# Patient Record
Sex: Male | Born: 1935 | Race: White | Hispanic: No | Smoking: Former smoker
Health system: Southern US, Community
[De-identification: ages and names within clinical notes are randomized; demographics above are authoritative.]

## PROBLEM LIST (undated history)

## (undated) DIAGNOSIS — C801 Malignant (primary) neoplasm, unspecified: Secondary | ICD-10-CM

## (undated) DIAGNOSIS — N289 Disorder of kidney and ureter, unspecified: Secondary | ICD-10-CM

---

## 2004-05-12 ENCOUNTER — Ambulatory Visit: Payer: Self-pay | Admitting: Gastroenterology

## 2004-07-06 ENCOUNTER — Ambulatory Visit: Payer: Self-pay | Admitting: Gastroenterology

## 2004-07-30 ENCOUNTER — Ambulatory Visit: Payer: Self-pay | Admitting: Gastroenterology

## 2004-08-02 ENCOUNTER — Ambulatory Visit: Payer: Self-pay | Admitting: Internal Medicine

## 2005-04-18 ENCOUNTER — Other Ambulatory Visit: Payer: Self-pay

## 2005-04-18 ENCOUNTER — Inpatient Hospital Stay: Payer: Self-pay | Admitting: Endocrinology

## 2005-04-19 ENCOUNTER — Other Ambulatory Visit: Payer: Self-pay

## 2005-04-25 ENCOUNTER — Ambulatory Visit: Payer: Self-pay | Admitting: Gastroenterology

## 2005-04-27 ENCOUNTER — Ambulatory Visit: Payer: Self-pay | Admitting: Gastroenterology

## 2005-05-04 ENCOUNTER — Ambulatory Visit: Payer: Self-pay | Admitting: Gastroenterology

## 2006-07-23 ENCOUNTER — Other Ambulatory Visit: Payer: Self-pay

## 2006-07-23 ENCOUNTER — Inpatient Hospital Stay: Payer: Self-pay | Admitting: Internal Medicine

## 2007-11-20 ENCOUNTER — Other Ambulatory Visit: Payer: Self-pay

## 2007-11-20 ENCOUNTER — Ambulatory Visit: Payer: Self-pay | Admitting: Ophthalmology

## 2008-01-14 ENCOUNTER — Ambulatory Visit: Payer: Self-pay | Admitting: Ophthalmology

## 2009-04-15 ENCOUNTER — Ambulatory Visit: Payer: Self-pay | Admitting: Gastroenterology

## 2010-01-05 ENCOUNTER — Ambulatory Visit: Payer: Self-pay | Admitting: Internal Medicine

## 2010-01-15 ENCOUNTER — Ambulatory Visit: Payer: Self-pay | Admitting: Gastroenterology

## 2012-07-31 ENCOUNTER — Emergency Department: Payer: Self-pay | Admitting: Emergency Medicine

## 2012-07-31 LAB — CBC
HCT: 41.2 % (ref 40.0–52.0)
HGB: 13.7 g/dL (ref 13.0–18.0)
MCH: 29.7 pg (ref 26.0–34.0)
MCHC: 33.3 g/dL (ref 32.0–36.0)
Platelet: 204 10*3/uL (ref 150–440)
RBC: 4.62 10*6/uL (ref 4.40–5.90)

## 2012-07-31 LAB — COMPREHENSIVE METABOLIC PANEL
Albumin: 3.6 g/dL (ref 3.4–5.0)
Alkaline Phosphatase: 77 U/L (ref 50–136)
Anion Gap: 7 (ref 7–16)
Calcium, Total: 8.9 mg/dL (ref 8.5–10.1)
Chloride: 104 mmol/L (ref 98–107)
Creatinine: 1.65 mg/dL — ABNORMAL HIGH (ref 0.60–1.30)
EGFR (Non-African Amer.): 37 — ABNORMAL LOW
Osmolality: 270 (ref 275–301)
SGOT(AST): 18 U/L (ref 15–37)
Sodium: 135 mmol/L — ABNORMAL LOW (ref 136–145)

## 2012-07-31 LAB — TROPONIN I: Troponin-I: <0.02 ng/mL

## 2012-07-31 LAB — CK TOTAL AND CKMB (NOT AT ARMC): CK, Total: 119 U/L (ref 35–232)

## 2013-05-25 ENCOUNTER — Emergency Department: Payer: Self-pay | Admitting: Emergency Medicine

## 2013-05-25 LAB — URINALYSIS, COMPLETE
Bilirubin,UR: NEGATIVE
Nitrite: POSITIVE
Ph: 6 (ref 4.5–8.0)
Protein: NEGATIVE
Specific Gravity: 1.004 (ref 1.003–1.030)

## 2013-05-25 LAB — CBC
HCT: 40.5 % (ref 40.0–52.0)
MCH: 29.9 pg (ref 26.0–34.0)
MCHC: 33.7 g/dL (ref 32.0–36.0)
MCV: 89 fL (ref 80–100)
RBC: 4.56 10*6/uL (ref 4.40–5.90)
RDW: 14.1 % (ref 11.5–14.5)
WBC: 9.9 10*3/uL (ref 3.8–10.6)

## 2013-05-25 LAB — BASIC METABOLIC PANEL
Anion Gap: 6 — ABNORMAL LOW (ref 7–16)
Chloride: 103 mmol/L (ref 98–107)
Co2: 24 mmol/L (ref 21–32)
Creatinine: 1.53 mg/dL — ABNORMAL HIGH (ref 0.60–1.30)
EGFR (African American): 46 — ABNORMAL LOW
Osmolality: 268 (ref 275–301)
Sodium: 133 mmol/L — ABNORMAL LOW (ref 136–145)

## 2015-02-05 ENCOUNTER — Emergency Department: Payer: Medicare Other

## 2015-02-05 ENCOUNTER — Inpatient Hospital Stay
Admission: EM | Admit: 2015-02-05 | Discharge: 2015-02-13 | DRG: 477 | Disposition: A | Discharge status: Skilled Nursing Facility | Payer: Medicare Other | Attending: Internal Medicine | Admitting: Internal Medicine

## 2015-02-05 ENCOUNTER — Encounter: Payer: Self-pay | Admitting: *Deleted

## 2015-02-05 DIAGNOSIS — R0600 Dyspnea, unspecified: Secondary | ICD-10-CM

## 2015-02-05 DIAGNOSIS — J479 Bronchiectasis, uncomplicated: Secondary | ICD-10-CM | POA: Diagnosis present

## 2015-02-05 DIAGNOSIS — H3530 Unspecified macular degeneration: Secondary | ICD-10-CM | POA: Diagnosis present

## 2015-02-05 DIAGNOSIS — N390 Urinary tract infection, site not specified: Secondary | ICD-10-CM | POA: Diagnosis present

## 2015-02-05 DIAGNOSIS — J9690 Respiratory failure, unspecified, unspecified whether with hypoxia or hypercapnia: Secondary | ICD-10-CM

## 2015-02-05 DIAGNOSIS — Z66 Do not resuscitate: Secondary | ICD-10-CM | POA: Diagnosis not present

## 2015-02-05 DIAGNOSIS — Z87891 Personal history of nicotine dependence: Secondary | ICD-10-CM

## 2015-02-05 DIAGNOSIS — G9341 Metabolic encephalopathy: Secondary | ICD-10-CM | POA: Diagnosis not present

## 2015-02-05 DIAGNOSIS — I10 Essential (primary) hypertension: Secondary | ICD-10-CM | POA: Diagnosis present

## 2015-02-05 DIAGNOSIS — Z79899 Other long term (current) drug therapy: Secondary | ICD-10-CM | POA: Diagnosis not present

## 2015-02-05 DIAGNOSIS — K2270 Barrett's esophagus without dysplasia: Secondary | ICD-10-CM | POA: Diagnosis present

## 2015-02-05 DIAGNOSIS — F05 Delirium due to known physiological condition: Secondary | ICD-10-CM | POA: Diagnosis not present

## 2015-02-05 DIAGNOSIS — J8410 Pulmonary fibrosis, unspecified: Secondary | ICD-10-CM | POA: Diagnosis not present

## 2015-02-05 DIAGNOSIS — W07XXXA Fall from chair, initial encounter: Secondary | ICD-10-CM | POA: Diagnosis present

## 2015-02-05 DIAGNOSIS — J690 Pneumonitis due to inhalation of food and vomit: Secondary | ICD-10-CM | POA: Diagnosis not present

## 2015-02-05 DIAGNOSIS — I959 Hypotension, unspecified: Secondary | ICD-10-CM | POA: Diagnosis not present

## 2015-02-05 DIAGNOSIS — M818 Other osteoporosis without current pathological fracture: Secondary | ICD-10-CM | POA: Diagnosis present

## 2015-02-05 DIAGNOSIS — K920 Hematemesis: Secondary | ICD-10-CM | POA: Diagnosis not present

## 2015-02-05 DIAGNOSIS — M1990 Unspecified osteoarthritis, unspecified site: Secondary | ICD-10-CM | POA: Diagnosis present

## 2015-02-05 DIAGNOSIS — I2510 Atherosclerotic heart disease of native coronary artery without angina pectoris: Secondary | ICD-10-CM | POA: Diagnosis present

## 2015-02-05 DIAGNOSIS — K5641 Fecal impaction: Secondary | ICD-10-CM | POA: Diagnosis present

## 2015-02-05 DIAGNOSIS — R0902 Hypoxemia: Secondary | ICD-10-CM

## 2015-02-05 DIAGNOSIS — Z86010 Personal history of colonic polyps: Secondary | ICD-10-CM | POA: Diagnosis not present

## 2015-02-05 DIAGNOSIS — Z515 Encounter for palliative care: Secondary | ICD-10-CM | POA: Diagnosis not present

## 2015-02-05 DIAGNOSIS — I272 Other secondary pulmonary hypertension: Secondary | ICD-10-CM | POA: Diagnosis present

## 2015-02-05 DIAGNOSIS — J9621 Acute and chronic respiratory failure with hypoxia: Secondary | ICD-10-CM | POA: Diagnosis not present

## 2015-02-05 DIAGNOSIS — J9600 Acute respiratory failure, unspecified whether with hypoxia or hypercapnia: Secondary | ICD-10-CM | POA: Diagnosis not present

## 2015-02-05 DIAGNOSIS — Y95 Nosocomial condition: Secondary | ICD-10-CM | POA: Diagnosis not present

## 2015-02-05 DIAGNOSIS — I4891 Unspecified atrial fibrillation: Secondary | ICD-10-CM | POA: Diagnosis present

## 2015-02-05 DIAGNOSIS — J84112 Idiopathic pulmonary fibrosis: Secondary | ICD-10-CM | POA: Diagnosis present

## 2015-02-05 DIAGNOSIS — Z885 Allergy status to narcotic agent status: Secondary | ICD-10-CM

## 2015-02-05 DIAGNOSIS — IMO0002 Reserved for concepts with insufficient information to code with codable children: Secondary | ICD-10-CM

## 2015-02-05 DIAGNOSIS — E871 Hypo-osmolality and hyponatremia: Secondary | ICD-10-CM | POA: Diagnosis present

## 2015-02-05 DIAGNOSIS — Z7982 Long term (current) use of aspirin: Secondary | ICD-10-CM

## 2015-02-05 DIAGNOSIS — S22080A Wedge compression fracture of T11-T12 vertebra, initial encounter for closed fracture: Secondary | ICD-10-CM | POA: Diagnosis present

## 2015-02-05 DIAGNOSIS — Z419 Encounter for procedure for purposes other than remedying health state, unspecified: Secondary | ICD-10-CM

## 2015-02-05 DIAGNOSIS — K5900 Constipation, unspecified: Secondary | ICD-10-CM | POA: Diagnosis present

## 2015-02-05 DIAGNOSIS — S22088A Other fracture of T11-T12 vertebra, initial encounter for closed fracture: Secondary | ICD-10-CM | POA: Diagnosis present

## 2015-02-05 DIAGNOSIS — Y92009 Unspecified place in unspecified non-institutional (private) residence as the place of occurrence of the external cause: Secondary | ICD-10-CM | POA: Diagnosis not present

## 2015-02-05 DIAGNOSIS — K210 Gastro-esophageal reflux disease with esophagitis: Secondary | ICD-10-CM | POA: Diagnosis present

## 2015-02-05 DIAGNOSIS — J9601 Acute respiratory failure with hypoxia: Secondary | ICD-10-CM | POA: Diagnosis not present

## 2015-02-05 HISTORY — DX: Disorder of kidney and ureter, unspecified: N28.9

## 2015-02-05 HISTORY — DX: Malignant (primary) neoplasm, unspecified: C80.1

## 2015-02-05 LAB — CBC WITH DIFFERENTIAL/PLATELET
BASOS PCT: 1 %
Basophils Absolute: 0.1 10*3/uL (ref 0–0.1)
Eosinophils Absolute: 0.3 10*3/uL (ref 0–0.7)
Eosinophils Relative: 2 %
HEMATOCRIT: 46 % (ref 40.0–52.0)
Hemoglobin: 15.2 g/dL (ref 13.0–18.0)
LYMPHS ABS: 3.4 10*3/uL (ref 1.0–3.6)
Lymphocytes Relative: 24 %
MCH: 29.7 pg (ref 26.0–34.0)
MCHC: 33.2 g/dL (ref 32.0–36.0)
MCV: 89.7 fL (ref 80.0–100.0)
MONOS PCT: 9 %
Monocytes Absolute: 1.3 10*3/uL — ABNORMAL HIGH (ref 0.2–1.0)
Neutro Abs: 9 10*3/uL — ABNORMAL HIGH (ref 1.4–6.5)
Neutrophils Relative %: 64 %
Platelets: 192 10*3/uL (ref 150–440)
RBC: 5.12 MIL/uL (ref 4.40–5.90)
RDW: 14.6 % — ABNORMAL HIGH (ref 11.5–14.5)
WBC: 14.2 10*3/uL — AB (ref 3.8–10.6)

## 2015-02-05 LAB — COMPREHENSIVE METABOLIC PANEL
ALBUMIN: 3.9 g/dL (ref 3.5–5.0)
ALT: 9 U/L — AB (ref 17–63)
AST: 24 U/L (ref 15–41)
Alkaline Phosphatase: 69 U/L (ref 38–126)
Anion gap: 10 (ref 5–15)
BILIRUBIN TOTAL: 1 mg/dL (ref 0.3–1.2)
BUN: 20 mg/dL (ref 6–20)
CALCIUM: 8.9 mg/dL (ref 8.9–10.3)
CO2: 23 mmol/L (ref 22–32)
Chloride: 96 mmol/L — ABNORMAL LOW (ref 101–111)
Creatinine, Ser: 1.16 mg/dL (ref 0.61–1.24)
GFR calc Af Amer: >60 mL/min (ref >60)
GFR, EST NON AFRICAN AMERICAN: 54 mL/min — AB (ref >60)
Glucose, Bld: 104 mg/dL — ABNORMAL HIGH (ref 65–99)
POTASSIUM: 4.6 mmol/L (ref 3.5–5.1)
Sodium: 129 mmol/L — ABNORMAL LOW (ref 135–145)
Total Protein: 7.7 g/dL (ref 6.5–8.1)

## 2015-02-05 LAB — URINALYSIS COMPLETE WITH MICROSCOPIC (ARMC ONLY)
BILIRUBIN URINE: NEGATIVE
Glucose, UA: NEGATIVE mg/dL
Ketones, ur: NEGATIVE mg/dL
Nitrite: POSITIVE — AB
PH: 5 (ref 5.0–8.0)
PROTEIN: NEGATIVE mg/dL
Specific Gravity, Urine: 1.013 (ref 1.005–1.030)

## 2015-02-05 LAB — TROPONIN I: Troponin I: <0.03 ng/mL (ref <0.031)

## 2015-02-05 MED ORDER — MORPHINE SULFATE 2 MG/ML IJ SOLN
2.0000 mg | INTRAMUSCULAR | Status: DC | PRN
  Administered 2015-02-05 – 2015-02-06 (×3): 2 mg via INTRAVENOUS
  Administered 2015-02-06: 1 mg via INTRAVENOUS
  Administered 2015-02-07 – 2015-02-08 (×2): 2 mg via INTRAVENOUS
  Filled 2015-02-05 (×6): qty 1

## 2015-02-05 MED ORDER — DOXEPIN HCL 25 MG PO CAPS
25.0000 mg | ORAL_CAPSULE | Freq: Every day | ORAL | Status: DC
  Administered 2015-02-05 – 2015-02-12 (×7): 25 mg via ORAL
  Filled 2015-02-05 (×10): qty 1

## 2015-02-05 MED ORDER — AMLODIPINE BESY-BENAZEPRIL HCL 5-10 MG PO CAPS: 1.0000 | ORAL_CAPSULE | Freq: Every day | ORAL | Status: DC

## 2015-02-05 MED ORDER — PANTOPRAZOLE SODIUM 40 MG PO TBEC
40.0000 mg | DELAYED_RELEASE_TABLET | Freq: Every day | ORAL | Status: DC
  Administered 2015-02-06: 40 mg via ORAL
  Filled 2015-02-05: qty 1

## 2015-02-05 MED ORDER — LORAZEPAM 1 MG PO TABS
1.0000 mg | ORAL_TABLET | Freq: Three times a day (TID) | ORAL | Status: DC
  Administered 2015-02-05 – 2015-02-13 (×21): 1 mg via ORAL
  Filled 2015-02-05: qty 1
  Filled 2015-02-05: qty 2
  Filled 2015-02-05: qty 1
  Filled 2015-02-05: qty 2
  Filled 2015-02-05: qty 1
  Filled 2015-02-05: qty 2
  Filled 2015-02-05: qty 1
  Filled 2015-02-05: qty 2
  Filled 2015-02-05 (×13): qty 1

## 2015-02-05 MED ORDER — HEPARIN SODIUM (PORCINE) 5000 UNIT/ML IJ SOLN
5000.0000 [IU] | Freq: Three times a day (TID) | INTRAMUSCULAR | Status: DC
  Administered 2015-02-05 – 2015-02-06 (×2): 5000 [IU] via SUBCUTANEOUS
  Filled 2015-02-05 (×2): qty 1

## 2015-02-05 MED ORDER — ACETAMINOPHEN 325 MG PO TABS
650.0000 mg | ORAL_TABLET | Freq: Four times a day (QID) | ORAL | Status: DC | PRN
  Administered 2015-02-12 (×2): 650 mg via ORAL
  Filled 2015-02-05 (×2): qty 2

## 2015-02-05 MED ORDER — ASPIRIN EC 81 MG PO TBEC
81.0000 mg | DELAYED_RELEASE_TABLET | Freq: Every day | ORAL | Status: DC
  Administered 2015-02-06 – 2015-02-13 (×8): 81 mg via ORAL
  Filled 2015-02-05 (×8): qty 1

## 2015-02-05 MED ORDER — AMLODIPINE BESYLATE 5 MG PO TABS
5.0000 mg | ORAL_TABLET | Freq: Every day | ORAL | Status: DC
  Administered 2015-02-06 – 2015-02-09 (×4): 5 mg via ORAL
  Filled 2015-02-05 (×4): qty 1

## 2015-02-05 MED ORDER — ONDANSETRON HCL 4 MG PO TABS: 4.0000 mg | ORAL_TABLET | Freq: Four times a day (QID) | ORAL | Status: DC | PRN

## 2015-02-05 MED ORDER — MAGNESIUM HYDROXIDE 400 MG/5ML PO SUSP
30.0000 mL | Freq: Every day | ORAL | Status: DC | PRN
  Administered 2015-02-06: 30 mL via ORAL
  Filled 2015-02-05: qty 30

## 2015-02-05 MED ORDER — ONDANSETRON HCL 4 MG/2ML IJ SOLN
4.0000 mg | Freq: Four times a day (QID) | INTRAMUSCULAR | Status: DC | PRN
  Administered 2015-02-06 (×2): 4 mg via INTRAVENOUS
  Filled 2015-02-05 (×2): qty 2

## 2015-02-05 MED ORDER — DEXTROSE 5 % IV SOLN
1.0000 g | INTRAVENOUS | Status: DC
  Administered 2015-02-05 – 2015-02-07 (×3): 1 g via INTRAVENOUS
  Filled 2015-02-05 (×4): qty 10

## 2015-02-05 MED ORDER — OXYCODONE HCL 5 MG PO TABS
5.0000 mg | ORAL_TABLET | ORAL | Status: DC | PRN
  Administered 2015-02-06 – 2015-02-11 (×4): 5 mg via ORAL
  Filled 2015-02-05: qty 1
  Filled 2015-02-05: qty 2
  Filled 2015-02-05 (×2): qty 1

## 2015-02-05 MED ORDER — ACETAMINOPHEN 650 MG RE SUPP: 650.0000 mg | Freq: Four times a day (QID) | RECTAL | Status: DC | PRN

## 2015-02-05 MED ORDER — CEFTRIAXONE SODIUM IN DEXTROSE 20 MG/ML IV SOLN: 1.0000 g | INTRAVENOUS | Status: DC

## 2015-02-05 MED ORDER — LINACLOTIDE 145 MCG PO CAPS
145.0000 ug | ORAL_CAPSULE | Freq: Every day | ORAL | Status: DC
  Administered 2015-02-06 – 2015-02-13 (×8): 145 ug via ORAL
  Filled 2015-02-05 (×9): qty 1

## 2015-02-05 MED ORDER — SODIUM CHLORIDE 0.9 % IV SOLN
INTRAVENOUS | Status: DC
  Administered 2015-02-05 – 2015-02-06 (×2): via INTRAVENOUS

## 2015-02-05 MED ORDER — BENAZEPRIL HCL 20 MG PO TABS
10.0000 mg | ORAL_TABLET | Freq: Every day | ORAL | Status: DC
  Administered 2015-02-06 – 2015-02-09 (×4): 10 mg via ORAL
  Filled 2015-02-05 (×3): qty 1
  Filled 2015-02-05: qty 2

## 2015-02-05 NOTE — ED Notes (Signed)
Past several days c/o low abd pain,, saw md yesterday for uti, has been on oral laxatives, no bm past week, some nausea

## 2015-02-05 NOTE — ED Notes (Signed)
Diagnosed with UTI yesterday, no BM in 1 week despite OTC medications. Pain in lower back x 1 week, worse with movement. Difficulty taking antibiotic due to nausea.

## 2015-02-05 NOTE — H&P (Signed)
[facility] [place] NAME: Richard Marsh    MR#:  [id number]  DATE OF BIRTH:  07/17/1922   DATE OF ADMISSION:  02/05/2015  PRIMARY CARE PHYSICIAN: Kary Kos  REQUESTING/REFERRING PHYSICIAN: Malinda  CHIEF COMPLAINT:    back pain, constipation  HISTORY OF PRESENT ILLNESS:  Richard Marsh  is a 81 y.o. male with a known history of chronic constipation, GERD with esophagitis, essential hypertension presenting with back pain. Denies any recent trauma or falls approximately 1 week prior had an episode where he apparently "slid off the couch" no pain at that time. Now describes back pain, lower back over the spine approximately 4 days worse with movement, nonradiating, "pain" quality, 8/10 intensity. Has chronic issues with constipation however the last bowel movement approximately 1 week ago still passing flatus, poor by mouth intake.  PAST MEDICAL HISTORY:   Past Medical History  Diagnosis Date  . Cancer   . Renal disorder     PAST SURGICAL HISTORY:  History reviewed. No pertinent past surgical history.  SOCIAL HISTORY:   History  Substance Use Topics  . Smoking status: Former Research scientist (life sciences)  . Smokeless tobacco: Not on file  . Alcohol Use: No    FAMILY HISTORY:   Family History  Problem Relation Age of Onset  . Heart failure Neg Hx     DRUG ALLERGIES:   Allergies  Allergen Reactions  . Demerol [Meperidine] Anaphylaxis  . Statins Other (See Comments)    Reaction:  Unknown     REVIEW OF SYSTEMS:  REVIEW OF SYSTEMS:  CONSTITUTIONAL: Denies fevers, chills, fatigue, weakness.  EYES: Denies blurred vision, double vision, or eye pain.  EARS, NOSE, THROAT: Denies tinnitus, ear pain, hearing loss.  RESPIRATORY: denies cough, shortness of breath, wheezing  CARDIOVASCULAR: Denies chest pain, palpitations, edema.  GASTROINTESTINAL: Positive, nausea, denies vomiting, diarrhea, abdominal pain. Positive constipation GENITOURINARY:  Denies dysuria, hematuria.  ENDOCRINE: Denies nocturia or thyroid problems. HEMATOLOGIC AND LYMPHATIC: Denies easy bruising or bleeding.  SKIN: Denies rash or lesions.  MUSCULOSKELETAL: Denies pain in neck, shoulder, knees, hips, or further arthritic symptoms. Positive back pain NEUROLOGIC: Denies paralysis, paresthesias.  PSYCHIATRIC: Denies anxiety or depressive symptoms. Otherwise full review of systems performed by me is negative.   MEDICATIONS AT HOME:   Prior to Admission medications   Medication Sig Start Date End Date Taking? Authorizing Provider  amLODipine-benazepril (LOTREL) 5-10 MG per capsule Take 1 capsule by mouth daily.   Yes Historical Provider, MD  aspirin EC 81 MG tablet Take 81 mg by mouth daily.   Yes Historical Provider, MD  doxepin (SINEQUAN) 25 MG capsule Take 25 mg by mouth at bedtime.   Yes Historical Provider, MD  Linaclotide Rolan Lipa) 145 MCG CAPS capsule Take 145 mcg by mouth daily.   Yes Historical Provider, MD  LORazepam (ATIVAN) 1 MG tablet Take 1 mg by mouth 3 (three) times daily.   Yes Historical Provider, MD  omeprazole (PRILOSEC) 20 MG capsule Take 20 mg by mouth 2 (two) times daily.   Yes Historical Provider, MD      VITAL SIGNS:  Blood pressure 166/88, pulse 80, temperature 97.9 F (36.6 C), temperature source Oral, resp. rate 20, height 5\' 10"  (1.778 m), weight 160 lb (72.576 kg), SpO2 94 %.  PHYSICAL EXAMINATION:  VITAL SIGNS: Filed Vitals:   02/05/15 2154  BP: 166/88  Pulse: 80  Temp:   Resp: 20   GENERAL:81 y.o.male currently in no acute distress.  HEAD:  Normocephalic, atraumatic.  EYES: Pupils equal, round, reactive to light. Extraocular muscles intact. No scleral icterus.  MOUTH: Moist mucosal membrane. Dentition intact. No abscess noted.  EAR, NOSE, THROAT: Clear without exudates. No external lesions.  NECK: Supple. No thyromegaly. No nodules. No JVD.  PULMONARY: Clear to ascultation, without wheeze rails or rhonci. No use of  accessory muscles, Good respiratory effort. good air entry bilaterally CHEST: Nontender to palpation.  CARDIOVASCULAR: S1 and S2. Regular rate and rhythm. No murmurs, rubs, or gallops. No edema. Pedal pulses 2+ bilaterally.  GASTROINTESTINAL: Soft, nontender, nondistended. No masses. Positive bowel sounds. No hepatosplenomegaly.  MUSCULOSKELETAL: No swelling, clubbing, or edema. Range of motion full in all extremities. Pain on palpation over the spine and lower back no paraspinal tenderness. NEUROLOGIC: Cranial nerves II through XII are intact. No gross focal neurological deficits. Sensation intact. Reflexes intact.  SKIN: No ulceration, lesions, rashes, or cyanosis. Skin warm and dry. Turgor intact.  PSYCHIATRIC: Mood, affect within normal limits. The patient is awake, alert and oriented x 3. Insight, judgment intact.    LABORATORY PANEL:   CBC  Recent Labs Lab 02/05/15 1934  WBC 14.2*  HGB 15.2  HCT 46.0  PLT 192   ------------------------------------------------------------------------------------------------------------------  Chemistries   Recent Labs Lab 02/05/15 1934  NA 129*  K 4.6  CL 96*  CO2 23  GLUCOSE 104*  BUN 20  CREATININE 1.16  CALCIUM 8.9  AST 24  ALT 9*  ALKPHOS 69  BILITOT 1.0   ------------------------------------------------------------------------------------------------------------------  Cardiac Enzymes  Recent Labs Lab 02/05/15 1934  TROPONINI <0.03   ------------------------------------------------------------------------------------------------------------------  RADIOLOGY:  Dg Lumbar Spine Complete  02/05/2015   CLINICAL DATA:  Low abdominal pain and lower back pain for 4 days.  EXAM: LUMBAR SPINE - COMPLETE 4+ VIEW  COMPARISON:  None.  FINDINGS: There are moderate compressions of T12 and L2. These are probably new or worsened from a lateral chest radiograph of 07/31/2012, but not necessarily acute. The other lumbar vertebrae are  normal in height. Moderately severe degenerative disc disease is present at L4-5, and there is sclerotic facet arthropathy at L5-S1, left greater than right. There is a grade 1 retrolisthesis at L4-5 which appears to be degenerative. There is no bone lesion or bony destruction. Sacroiliac joints appear unremarkable.  IMPRESSION: Moderately severe lumbar degenerative disc and facet change, greatest at L4-5 and L5-S1.  Moderate compressions of T12 and L2 which are of indeterminate age but probably new since 07/31/2012.   Electronically Signed   By: Andreas Newport M.D.   On: 02/05/2015 19:24   Dg Abd 1 View  02/05/2015   CLINICAL DATA:  Lower abdominal pain and low back pain.  EXAM: ABDOMEN - 1 VIEW  COMPARISON:  CT scan of the abdomen dated 04/27/2005  FINDINGS: There is prominent gas in the ascending and transverse portions of the colon but the colon is not abnormally distended. No dilated small bowel.  There are compression deformities of T12 and L2, age indeterminate.  IMPRESSION: Benign appearing abdomen. Compression fractures in the lower thoracic and upper lumbar spine, age indeterminate.   Electronically Signed   By: Lorriane Shire M.D.   On: 02/05/2015 19:24   Ct Thoracic Spine Wo Contrast  02/05/2015   CLINICAL DATA:  Compression fractures of T12 and L2.  Back pain.  EXAM: CT THORACIC SPINE WITHOUT CONTRAST  TECHNIQUE: Multidetector CT imaging of the thoracic spine was performed without intravenous contrast administration. Multiplanar CT image reconstructions were also generated.  COMPARISON:  Radiographs  dated 02/05/2015  FINDINGS: There is an acute fracture of the superior endplate of U13 with minimal protrusion of the posterior superior aspect of T12 into the spinal canal without neural impingement. There is no disc protrusion at T11-12 or T12-L1. There is slight soft tissue stranding to the right of T12.  There is also a compression fracture of the inferior endplate of T3 which is new since the  prior chest x-ray of 07/31/2010. I think this fracture is subacute. There is no paraspinal soft tissue stranding.  There is slight accentuation of the thoracic kyphosis. Remainder of the thoracic spine is normal with no disc protrusions or masses. The patient has severe chronic interstitial lung disease. Extensive coronary artery calcification. Moderate hiatal hernia.  IMPRESSION: Acute benign appearing fracture of the superior endplate of K44. Subacute benign appearing fracture of the inferior endplate of T3. No neural impingement at either level.  Severe chronic interstitial lung disease.   Electronically Signed   By: Lorriane Shire M.D.   On: 02/05/2015 20:58   Ct Lumbar Spine Wo Contrast  02/05/2015   CLINICAL DATA:  Back pain for 1 week. T12 and L2 compression fractures.  EXAM: CT LUMBAR SPINE WITHOUT CONTRAST  TECHNIQUE: Multidetector CT imaging of the lumbar spine was performed without intravenous contrast administration. Multiplanar CT image reconstructions were also generated.  COMPARISON:  Radiographs dated 02/04/2014  FINDINGS: There is an acute benign appearing fracture of the superior endplate of W10 with minimal bone protrusion into the spinal canal without neural impingement.  There is an old compression fracture of L2, healed. Prominent Schmorl's node in the superior endplate. No protrusion of bone or disc material into the spinal canal.  The patient has severe degenerative disc disease at L3-4, L4-5, and L5-S1.  L2-3:  Small broad-based disc bulge with no neural impingement.  L3-4:  Small broad-based disc bulge with no neural impingement.  L4-5: Severe right facet arthritis. Moderate right foraminal stenosis. No disc protrusion.  L5-S1: No disc protrusion or neural impingement. Moderately severe left facet arthritis.  IMPRESSION: 1. Acute benign appearing superior endplate fracture of U72 without neural impingement. 2. Old compression fracture of L2. 3. No focal neural impingement.    Electronically Signed   By: Lorriane Shire M.D.   On: 02/05/2015 21:01    EKG:   Orders placed or performed in visit on 05/25/13  . EKG 12-Lead    IMPRESSION AND PLAN:   81 year old Caucasian gentleman history of GERD with esophagitis presenting with back pain.  1. T12 compression fracture, initial evaluation: Provide pain medications consult orthopedic surgery further input, had bowel regimen 2. Constipation: Initiate bowel regiment 3. Hyponatremia, given poor by mouth intake: Sodium deficit around 360 mEq, IV fluid hydration normal saline follow sodium levels 4. UTI, site unspecified: Ceftriaxone, follow culture data and adjust antibiotics accordingly 5. GERD with esophagitis: PPI therapy 6. Venous thromboembolism prophylactic: Heparin subcutaneous    All the records are reviewed and case discussed with ED provider. Management plans discussed with the patient, family and they are in agreement.  CODE STATUS: Full  TOTAL TIME TAKING CARE OF THIS PATIENT: 35 minutes.    Hower,  Karenann Cai.D on 02/05/2015 at 10:44 PM  Between 7am to 6pm - Pager - [phone]  After 6pm: House Pager: - [phone]  [facility]  Office  [phone]  CC: Primary care physician; Kary Kos

## 2015-02-05 NOTE — ED Provider Notes (Signed)
[facility] Emergency Department Provider Note  ____________________________________________  Time seen: Approximately 6:56 PM  I have reviewed the triage vital signs and the nursing notes.   HISTORY  Chief Complaint Fecal Impaction    HPI Richard Marsh is a 81 y.o. male patient complains of about a week or more low back pain. Since that started he's had trouble passing his stool. He is tried multiple over-the-counter remedies including mag citrate this morning with no results. His daughter who is here with him and provides most history is a Marine scientist. He is not having any numbness in his groin area. Or any weakness in the legs. Dr. Kary Kos diagnosed him with a UTI and when the mag citrate did not work today told him to come to the emergency room. Patient reports low back pain is worse with movement. Patient is getting nauseated and having difficulty taking Cipro for his UTI. Patient had not had a bowel movement for about a week.   Past Medical History  Diagnosis Date  . Cancer   . Renal disorder     There are no active problems to display for this patient.   History reviewed. No pertinent past surgical history.  Current Outpatient Rx  Name  Route  Sig  Dispense  Refill  . amLODipine-benazepril (LOTREL) 5-10 MG per capsule   Oral   Take 1 capsule by mouth daily.         Marland Kitchen aspirin EC 81 MG tablet   Oral   Take 81 mg by mouth daily.         Marland Kitchen doxepin (SINEQUAN) 25 MG capsule   Oral   Take 25 mg by mouth at bedtime.         . Linaclotide (LINZESS) 145 MCG CAPS capsule   Oral   Take 145 mcg by mouth daily.         Marland Kitchen LORazepam (ATIVAN) 1 MG tablet   Oral   Take 1 mg by mouth 3 (three) times daily.         Marland Kitchen omeprazole (PRILOSEC) 20 MG capsule   Oral   Take 20 mg by mouth 2 (two) times daily.           Allergies Demerol and Statins  No family history on file.  Social History History  Substance Use Topics  . Smoking status:  Former Research scientist (life sciences)  . Smokeless tobacco: Not on file  . Alcohol Use: No    Review of Systems Constitutional: No fever/chills Eyes: No visual changes. ENT: No sore throat. Cardiovascular: Denies chest pain. Respiratory: Denies shortness of breath. Gastrointestinal: No abdominal pain.  No nausea, no vomiting.  No diarrhea.  No constipation. Genitourinary: Negative for dysuria. Musculoskeletal: Negative for back pain. Skin: Negative for rash. Neurological: Negative for headaches, focal weakness or numbness.  10-point ROS otherwise negative.  ____________________________________________   PHYSICAL EXAM:  VITAL SIGNS: ED Triage Vitals  Enc Vitals Group     BP 02/05/15 1742 128/67 mmHg     Pulse Rate 02/05/15 1742 84     Resp 02/05/15 1742 20     Temp 02/05/15 1742 97.9 F (36.6 C)     Temp Source 02/05/15 1742 Oral     SpO2 02/05/15 1742 93 %     Weight 02/05/15 1742 160 lb (72.576 kg)     Height 02/05/15 1742 5\' 10"  (1.778 m)     Head Cir --      Peak Flow --  Pain Score 02/05/15 1743 5     Pain Loc --      Pain Edu? --      Excl. in [place]? --     Constitutional: Alert and oriented. Well appearing and in no acute distress. Eyes: Conjunctivae are normal. PERRL. EOMI. Head: Atraumatic. Nose: No congestion/rhinnorhea. Mouth/Throat: Mucous membranes are moist.  Oropharynx non-erythematous. Neck: No stridor.  Cardiovascular: Normal rate, regular rhythm. Grossly normal heart sounds.  Good peripheral circulation. Respiratory: Normal respiratory effort.  No retractions. Lungs CTAB. Gastrointestinal: Soft and nontender. No distention. No abdominal bruits. No CVA tenderness. Musculoskeletal: No lower extremity tenderness nor edema.  No joint effusions. L-spine is tender from [place] T12 and L2 worse and lower down around the sacrum. Neurologic:  Normal speech and language. No gross focal neurologic deficits are appreciated. No gait instability. Skin:  Skin is warm, dry and intact.  No rash noted. Psychiatric: Mood and affect are normal. Speech and behavior are normal. Rectal exam shows good tone nontender prostate Hemoccult-negative is no stool in the rectum very high at the end of my finger there is some stool ____________________________________________   LABS (all labs ordered are listed, but only abnormal results are displayed)  Labs Reviewed  COMPREHENSIVE METABOLIC PANEL - Abnormal; Notable for the following:    Sodium 129 (*)    Chloride 96 (*)    Glucose, Bld 104 (*)    ALT 9 (*)    GFR calc non Af Amer 54 (*)    All other components within normal limits  CBC WITH DIFFERENTIAL/PLATELET - Abnormal; Notable for the following:    WBC 14.2 (*)    RDW 14.6 (*)    Neutro Abs 9.0 (*)    Monocytes Absolute 1.3 (*)    All other components within normal limits  URINALYSIS COMPLETEWITH MICROSCOPIC ([facility] ONLY) - Abnormal; Notable for the following:    Color, Urine YELLOW (*)    APPearance CLEAR (*)    Hgb urine dipstick 3+ (*)    Nitrite POSITIVE (*)    Leukocytes, UA 1+ (*)    Bacteria, UA FEW (*)    Squamous Epithelial / LPF 0-5 (*)    All other components within normal limits  URINE CULTURE  TROPONIN I   ____________________________________________  EKG   ____________________________________________  RADIOLOGY  X-ray shows DJD in the lumbar spine with old L2 compression fracture and an acute T12 compression fracture. CT of the back shows some retropulsion of T12 but no neuro impingement I reviewed the x-rays but not the CT ____________________________________________   PROCEDURES   ____________________________________________   INITIAL IMPRESSION / ASSESSMENT AND PLAN / ED COURSE  Pertinent labs & imaging results that were available during my care of the patient were reviewed by me and considered in my medical decision making (see chart for details).  Patient's daughter reports he is not doing well at home unable to move. Patient is  ambulatory about coming into the hospital. ____________________________________________   FINAL CLINICAL IMPRESSION(S) / ED DIAGNOSES  Final diagnoses:  Compression fracture  Hyponatremia      Nena Polio, MD 02/05/15 2131

## 2015-02-05 NOTE — ED Notes (Signed)
Patient transported to CT 

## 2015-02-06 ENCOUNTER — Inpatient Hospital Stay: Payer: Medicare Other

## 2015-02-06 ENCOUNTER — Inpatient Hospital Stay: Payer: Medicare Other | Admitting: Anesthesiology

## 2015-02-06 ENCOUNTER — Encounter
Admission: EM | Disposition: A | Discharge status: Skilled Nursing Facility | Payer: Self-pay | Source: Home / Self Care | Attending: Internal Medicine

## 2015-02-06 HISTORY — PX: KYPHOPLASTY: SHX5884

## 2015-02-06 LAB — BASIC METABOLIC PANEL
ANION GAP: 9 (ref 5–15)
BUN: 18 mg/dL (ref 6–20)
CHLORIDE: 97 mmol/L — AB (ref 101–111)
CO2: 27 mmol/L (ref 22–32)
Calcium: 8.9 mg/dL (ref 8.9–10.3)
Creatinine, Ser: 1.16 mg/dL (ref 0.61–1.24)
GFR calc Af Amer: >60 mL/min (ref >60)
GFR, EST NON AFRICAN AMERICAN: 54 mL/min — AB (ref >60)
Glucose, Bld: 114 mg/dL — ABNORMAL HIGH (ref 65–99)
POTASSIUM: 4.4 mmol/L (ref 3.5–5.1)
Sodium: 133 mmol/L — ABNORMAL LOW (ref 135–145)

## 2015-02-06 LAB — MRSA PCR SCREENING: MRSA by PCR: NEGATIVE

## 2015-02-06 SURGERY — KYPHOPLASTY
Anesthesia: Monitor Anesthesia Care | Wound class: Clean

## 2015-02-06 MED ORDER — MIDAZOLAM HCL 2 MG/2ML IJ SOLN
INTRAMUSCULAR | Status: DC | PRN
  Administered 2015-02-06: .25 mg via INTRAVENOUS
  Administered 2015-02-06: 0.5 mg via INTRAVENOUS
  Administered 2015-02-06: .5 mg via INTRAVENOUS

## 2015-02-06 MED ORDER — IOPAMIDOL (ISOVUE-300) INJECTION 61%
INTRAVENOUS | Status: DC | PRN
  Administered 2015-02-06: 1 mL via INTRAVENOUS

## 2015-02-06 MED ORDER — FLEET ENEMA 7-19 GM/118ML RE ENEM: 1.0000 | ENEMA | Freq: Once | RECTAL | Status: AC | PRN

## 2015-02-06 MED ORDER — BUPIVACAINE-EPINEPHRINE (PF) 0.5% -1:200000 IJ SOLN
INTRAMUSCULAR | Status: DC | PRN
  Administered 2015-02-06: 10 mL via PERINEURAL

## 2015-02-06 MED ORDER — CEFAZOLIN SODIUM 1-5 GM-% IV SOLN
1.0000 g | INTRAVENOUS | Status: DC
  Filled 2015-02-06 (×2): qty 50

## 2015-02-06 MED ORDER — PROMETHAZINE HCL 25 MG/ML IJ SOLN
6.2500 mg | Freq: Four times a day (QID) | INTRAMUSCULAR | Status: DC | PRN
  Administered 2015-02-06: 6.25 mg via INTRAVENOUS

## 2015-02-06 MED ORDER — KETAMINE HCL 50 MG/ML IJ SOLN
INTRAMUSCULAR | Status: DC | PRN
  Administered 2015-02-06: 25 mg via INTRAMUSCULAR

## 2015-02-06 MED ORDER — PHENYLEPHRINE HCL 10 MG/ML IJ SOLN
INTRAMUSCULAR | Status: DC | PRN
  Administered 2015-02-06 (×2): 100 ug via INTRAVENOUS

## 2015-02-06 MED ORDER — CEFAZOLIN (ANCEF) 1 G IV SOLR
1.0000 g | INTRAVENOUS | Status: DC
Start: 2015-02-06 — End: 2015-02-06

## 2015-02-06 MED ORDER — FENTANYL CITRATE (PF) 100 MCG/2ML IJ SOLN: 25.0000 ug | INTRAMUSCULAR | Status: DC | PRN

## 2015-02-06 MED ORDER — LIDOCAINE HCL 1 % IJ SOLN
INTRAMUSCULAR | Status: DC | PRN
  Administered 2015-02-06 (×2): 10 mL

## 2015-02-06 MED ORDER — BISACODYL 10 MG RE SUPP
10.0000 mg | Freq: Once | RECTAL | Status: AC
  Administered 2015-02-07: 10 mg via RECTAL
  Filled 2015-02-06: qty 1

## 2015-02-06 MED ORDER — SODIUM CHLORIDE 0.9 % IV SOLN
INTRAVENOUS | Status: DC
  Administered 2015-02-06 – 2015-02-08 (×3): via INTRAVENOUS

## 2015-02-06 MED ORDER — PROPOFOL INFUSION 10 MG/ML OPTIME
INTRAVENOUS | Status: DC | PRN
  Administered 2015-02-06: 40 ug/kg/min via INTRAVENOUS

## 2015-02-06 MED ORDER — DOCUSATE SODIUM 100 MG PO CAPS
100.0000 mg | ORAL_CAPSULE | Freq: Two times a day (BID) | ORAL | Status: DC
  Administered 2015-02-07: 100 mg via ORAL
  Filled 2015-02-06: qty 1

## 2015-02-06 MED ORDER — ENOXAPARIN SODIUM 40 MG/0.4ML ~~LOC~~ SOLN
40.0000 mg | SUBCUTANEOUS | Status: DC
  Administered 2015-02-07 – 2015-02-09 (×3): 40 mg via SUBCUTANEOUS
  Filled 2015-02-06 (×3): qty 0.4

## 2015-02-06 MED ORDER — PANTOPRAZOLE SODIUM 40 MG IV SOLR
40.0000 mg | INTRAVENOUS | Status: DC
  Administered 2015-02-06 – 2015-02-09 (×4): 40 mg via INTRAVENOUS
  Filled 2015-02-06 (×4): qty 40

## 2015-02-06 MED ORDER — ONDANSETRON HCL 4 MG/2ML IJ SOLN: 4.0000 mg | Freq: Once | INTRAMUSCULAR | Status: AC | PRN

## 2015-02-06 MED ORDER — FENTANYL CITRATE (PF) 100 MCG/2ML IJ SOLN
INTRAMUSCULAR | Status: DC | PRN
  Administered 2015-02-06 (×2): 25 ug via INTRAVENOUS

## 2015-02-06 MED ORDER — BISACODYL 10 MG RE SUPP: 10.0000 mg | Freq: Every day | RECTAL | Status: DC | PRN

## 2015-02-06 MED ORDER — LACTATED RINGERS IV SOLN
INTRAVENOUS | Status: DC | PRN
  Administered 2015-02-06: 22:00:00 via INTRAVENOUS

## 2015-02-06 SURGICAL SUPPLY — 17 items
BNDG ADH 2 X3.75 FABRIC TAN LF (GAUZE/BANDAGES/DRESSINGS) ×3 IMPLANT
CEMENT KYPHON CX01A KIT/MIXER (Cement) ×3 IMPLANT
DEVICE BIOPSY BONE KYPHX (INSTRUMENTS) ×3 IMPLANT
DRAPE C-ARM XRAY 36X54 (DRAPES) ×3 IMPLANT
DURAPREP 26ML APPLICATOR (WOUND CARE) ×3 IMPLANT
GLOVE SURG CUT RESIS 520032000 (GLOVE) ×3 IMPLANT
GLOVE SURG ORTHO 9.0 STRL STRW (GLOVE) ×3 IMPLANT
GOWN SPECIALTY ULTRA XL (MISCELLANEOUS) ×3 IMPLANT
GOWN STRL REUS W/ TWL LRG LVL3 (GOWN DISPOSABLE) ×1 IMPLANT
GOWN STRL REUS W/TWL LRG LVL3 (GOWN DISPOSABLE) ×2
LIQUID BAND (GAUZE/BANDAGES/DRESSINGS) ×3 IMPLANT
NDL SAFETY 18GX1.5 (NEEDLE) ×3 IMPLANT
PACK KYPHOPLASTY (MISCELLANEOUS) ×3 IMPLANT
STRAP SAFETY BODY (MISCELLANEOUS) ×3 IMPLANT
SYR 20CC LL (SYRINGE) ×3 IMPLANT
TRAY KYPHOPAK 15/3 EXPRESS 1ST (MISCELLANEOUS) ×3 IMPLANT
TRAY KYPHOPAK 20/3 EXPRESS 1ST (MISCELLANEOUS) ×3 IMPLANT

## 2015-02-06 NOTE — Anesthesia Procedure Notes (Signed)
Date/Time: 02/06/2015 9:36 PM Performed by: Nelda Marseille Pre-anesthesia Checklist: Patient identified, Emergency Drugs available, Suction available, Patient being monitored and Timeout performed Oxygen Delivery Method: Nasal cannula

## 2015-02-06 NOTE — Care Management (Signed)
Patient was out of the room when I rounded. RNCM will need to follow. CM consult placed. List of home health agencies left in patient's room. RNCM please assess for DME needs also. Kyphoplasty determination pending.

## 2015-02-06 NOTE — Progress Notes (Signed)
Initial Nutrition Assessment  INTERVENTION:   Medical Food Supplement Therapy: will recommend Ensure BID once diet order able to be advanced   NUTRITION DIAGNOSIS:   Inadequate oral intake related to inability to eat as evidenced by NPO status.  GOAL:   Patient will meet greater than or equal to 90% of their needs  MONITOR:    (Energy Intake, digestive System, Anthropometrics)  REASON FOR ASSESSMENT:   Malnutrition Screening Tool    ASSESSMENT:   Pt admitted with UTI, constipation and T12 compression fracture. Pt scheduled for vertebroplasty.  Past Medical History  Diagnosis Date  . Cancer   . Renal disorder     Diet Order:  Diet NPO time specified    Current Nutrition: Pt NPO  Food/Nutrition-Related History: Pt reports poor po intake PTA. Pt reports eating toast times 2 andyogurt for breakfast, cheeseburger and mt dew for lunch and sandwich with Ensure for dinner most days. Pt reports appetite has decreased 'for a long time.'   Medications: Dulcolax, protonix, NS at  42mL/hr  Electrolyte/Renal Profile and Glucose Profile:   Recent Labs Lab 02/05/15 1934 02/06/15 0540  NA 129* 133*  K 4.6 4.4  CL 96* 97*  CO2 23 27  BUN 20 18  CREATININE 1.16 1.16  CALCIUM 8.9 8.9  GLUCOSE 104* 114*   Protein Profile:  Recent Labs Lab 02/05/15 1934  ALBUMIN 3.9    Gastrointestinal Profile: Last BM: unknown   Nutrition-Focused Physical Exam Findings: Nutrition-Focused physical exam completed. Findings are WDL for fat depletion however could not assess ribs, WDL for muscle depletion with the exception of mild thigh muscle depletion, and no edema.     Weight Change: Pt reports weight loss for the past 1-2 years. Pt reports weight of 190lbs a few years ago (17% weight loss).    Skin:  Reviewed, no issues   Height:   Ht Readings from Last 1 Encounters:  02/05/15 5\' 10"  (1.778 m)    Weight:   Wt Readings from Last 1 Encounters:  02/05/15 157 lb 11.2  oz (71.532 kg)     BMI:  Body mass index is 22.63 kg/(m^2).  Estimated Nutritional Needs:   Kcal:  BEE: 1352kcals, TEE: (IF 1.1-1.3)(AF 1.2) 1816-2147kcals  Protein:  72-86g protein (1.0-1.2g/kg)  Fluid:  1788-2123mL of fluid (25-24mL/kg)  EDUCATION NEEDS:   No education needs identified at this time   Grant, RD, LDN Pager [phone]

## 2015-02-06 NOTE — Progress Notes (Signed)
[place] unable to void since admission around 2300, bladder scan reading of 886mL. Dr Marcille Blanco, MD on call notified, ordered In & out cath. Nursing continues to monitor and assist with ADLs.

## 2015-02-06 NOTE — Consult Note (Signed)
Patient seen at request Dr. Roland Rack after he did initial evaluation for possible kyphoplasty. Patient's MRI does show a subacute T12 fracture with significant edema and has the appearance of a subacute fracture there is consistent with his history. He has severe tenderness to percussion at the T12 spinous process. He also has some lower backache but that is not as severe and is not was keeping him from being able to walk he does not have clonus or neuro deficits reviewed with the bone model in booklet with him and his granddaughter the nature of the procedure will try to have that done later today. Risks benefits possible competitions discussed. Also discussed this with his daughter who is a Marine scientist and [employer]

## 2015-02-06 NOTE — Progress Notes (Signed)
Informed by Dr Rudene Christians (ortho) patient noted to have coffee ground emesis post op Will add protonix

## 2015-02-06 NOTE — Consult Note (Addendum)
ORTHOPAEDIC CONSULTATION  REQUESTING PHYSICIAN: Epifanio Lesches, MD  Chief Complaint:   Mid lower back pain 4-5 days  History of Present Illness: Richard Marsh is a 81 y.o. male with a history of hypertension, gastroesophageal reflux disease with esophagitis, and chronic constipation who lives at home. Apparently, he presented to the emergency room last night complaining of a 4 day history of increased lower back pain. The patient denies any specific injury, but does recall "sliding off the couch" about a week ago and landing on his buttock. He did not notice any lower back pain immediately, but began developing pain several days later. His symptoms have persisted despite activity modification and over-the-counter medications. In the emergency room, x-rays and supplement CT scan confirmed the presence of a subacute T12 compression fracture involving the superior endplate with minimal retropulsion of the posterior elements. He also was noted to have a urinary tract infection and so was admitted to the hospitalist service. The patient denies any numbness or paresthesias to either lower extremity. He denies any loss of bowel or bladder control, but does acknowledge his chronic constipation issues, as well as the recent "kidney infection".  Past Medical History  Diagnosis Date  . Cancer   . Renal disorder    History reviewed. No pertinent past surgical history. History   Social History  . Marital Status: Widowed    Spouse Name: N/A  . Number of Children: N/A  . Years of Education: N/A   Social History Main Topics  . Smoking status: Former Research scientist (life sciences)  . Smokeless tobacco: Not on file  . Alcohol Use: No  . Drug Use: Not on file  . Sexual Activity: Not on file   Other Topics Concern  . None   Social History Narrative  . None   Family History  Problem Relation Age of Onset  . Heart failure Neg Hx    Allergies   Allergen Reactions  . Demerol [Meperidine] Anaphylaxis  . Statins Other (See Comments)    Reaction:  Unknown    Prior to Admission medications   Medication Sig Start Date End Date Taking? Authorizing Provider  amLODipine-benazepril (LOTREL) 5-10 MG per capsule Take 1 capsule by mouth daily.   Yes Historical Provider, MD  aspirin EC 81 MG tablet Take 81 mg by mouth daily.   Yes Historical Provider, MD  doxepin (SINEQUAN) 25 MG capsule Take 25 mg by mouth at bedtime.   Yes Historical Provider, MD  Linaclotide Rolan Lipa) 145 MCG CAPS capsule Take 145 mcg by mouth daily.   Yes Historical Provider, MD  LORazepam (ATIVAN) 1 MG tablet Take 1 mg by mouth 3 (three) times daily.   Yes Historical Provider, MD  omeprazole (PRILOSEC) 20 MG capsule Take 20 mg by mouth 2 (two) times daily.   Yes Historical Provider, MD   Dg Lumbar Spine Complete  02/05/2015   CLINICAL DATA:  Low abdominal pain and lower back pain for 4 days.  EXAM: LUMBAR SPINE - COMPLETE 4+ VIEW  COMPARISON:  None.  FINDINGS: There are moderate compressions of T12 and L2. These are probably new or worsened from a lateral chest radiograph of 07/31/2012, but not necessarily acute. The other lumbar vertebrae are normal in height. Moderately severe degenerative disc disease is present at L4-5, and there is sclerotic facet arthropathy at L5-S1, left greater than right. There is a grade 1 retrolisthesis at L4-5 which appears to be degenerative. There is no bone lesion or bony destruction. Sacroiliac joints appear unremarkable.  IMPRESSION: Moderately  severe lumbar degenerative disc and facet change, greatest at L4-5 and L5-S1.  Moderate compressions of T12 and L2 which are of indeterminate age but probably new since 07/31/2012.   Electronically Signed   By: Andreas [facility] M.D.   On: 02/05/2015 19:24   Dg Abd 1 View  02/05/2015   CLINICAL DATA:  Lower abdominal pain and low back pain.  EXAM: ABDOMEN - 1 VIEW  COMPARISON:  CT scan of the abdomen  dated 04/27/2005  FINDINGS: There is prominent gas in the ascending and transverse portions of the colon but the colon is not abnormally distended. No dilated small bowel.  There are compression deformities of T12 and L2, age indeterminate.  IMPRESSION: Benign appearing abdomen. Compression fractures in the lower thoracic and upper lumbar spine, age indeterminate.   Electronically Signed   By: Lorriane Shire M.D.   On: 02/05/2015 19:24   Ct Thoracic Spine Wo Contrast  02/05/2015   CLINICAL DATA:  Compression fractures of T12 and L2.  Back pain.  EXAM: CT THORACIC SPINE WITHOUT CONTRAST  TECHNIQUE: Multidetector CT imaging of the thoracic spine was performed without intravenous contrast administration. Multiplanar CT image reconstructions were also generated.  COMPARISON:  Radiographs dated 02/05/2015  FINDINGS: There is an acute fracture of the superior endplate of U31 with minimal protrusion of the posterior superior aspect of T12 into the spinal canal without neural impingement. There is no disc protrusion at T11-12 or T12-L1. There is slight soft tissue stranding to the right of T12.  There is also a compression fracture of the inferior endplate of T3 which is new since the prior chest x-ray of 07/31/2010. I think this fracture is subacute. There is no paraspinal soft tissue stranding.  There is slight accentuation of the thoracic kyphosis. Remainder of the thoracic spine is normal with no disc protrusions or masses. The patient has severe chronic interstitial lung disease. Extensive coronary artery calcification. Moderate hiatal hernia.  IMPRESSION: Acute benign appearing fracture of the superior endplate of S97. Subacute benign appearing fracture of the inferior endplate of T3. No neural impingement at either level.  Severe chronic interstitial lung disease.   Electronically Signed   By: Lorriane Shire M.D.   On: 02/05/2015 20:58   Ct Lumbar Spine Wo Contrast  02/05/2015   CLINICAL DATA:  Back pain for 1  week. T12 and L2 compression fractures.  EXAM: CT LUMBAR SPINE WITHOUT CONTRAST  TECHNIQUE: Multidetector CT imaging of the lumbar spine was performed without intravenous contrast administration. Multiplanar CT image reconstructions were also generated.  COMPARISON:  Radiographs dated 02/04/2014  FINDINGS: There is an acute benign appearing fracture of the superior endplate of W26 with minimal bone protrusion into the spinal canal without neural impingement.  There is an old compression fracture of L2, healed. Prominent Schmorl's node in the superior endplate. No protrusion of bone or disc material into the spinal canal.  The patient has severe degenerative disc disease at L3-4, L4-5, and L5-S1.  L2-3:  Small broad-based disc bulge with no neural impingement.  L3-4:  Small broad-based disc bulge with no neural impingement.  L4-5: Severe right facet arthritis. Moderate right foraminal stenosis. No disc protrusion.  L5-S1: No disc protrusion or neural impingement. Moderately severe left facet arthritis.  IMPRESSION: 1. Acute benign appearing superior endplate fracture of V78 without neural impingement. 2. Old compression fracture of L2. 3. No focal neural impingement.   Electronically Signed   By: Lorriane Shire M.D.   On: 02/05/2015 21:01  Positive ROS: All other systems have been reviewed and were otherwise negative with the exception of those mentioned in the HPI and as above.  Physical Exam: General:  Alert, no acute distress Psychiatric:  Patient is competent for consent with normal mood and affect   Cardiovascular:  No pedal edema Respiratory:  No wheezing, non-labored breathing GI:  Abdomen is soft and non-tender Skin:  No lesions in the area of chief complaint Neurologic:  Sensation intact distally Lymphatic:  No axillary or cervical lymphadenopathy  Orthopedic Exam:  Orthopedic examination is limited to the back and bilateral lower extremities. Skin inspection of his back is unremarkable. He  has mild tenderness to percussion over the thoracolumbar region centrally, but has no other areas of tenderness. He is able to actively flex and extend both knees, ankles, and feet without pain. He has 4+/5 strength with resisted dorsiflexion, plantarflexion, inversion, and eversion of both feet. Sensation is intact to light touch to all distributions in both lower extremities. He has good capillary refill to both feet.  X-rays:  Recent x-rays and CT scans of the thoracic and lumbar spine are available for review. The findings are as described above.  Assessment: Acute T12 compression fracture.  Plan: The treatment options are discussed with the patient. He would like to consider a vertebroplasty. Therefore, I will discuss his case with Dr. Rudene Christians, my partner, who does perform this procedure to see when he can get the patient on the schedule. Meanwhile, he should be treated symptomatically with appropriate pain medication. In addition, I will order a lumbosacral corset for him to wear when he attempts to get out of bed in order to provide some additional stability to his lower back.  Thank you for ask me to participate in the care of this most pleasant yet unfortunate man. We will be happy to follow him with you.    Pascal Lux, MD  Beeper #:  [phone]  02/06/2015 7:53 AM  I spoke with Dr. Rudene Christians. He would like to have an MRI scan of the lumbar spine ordered in order to be sure that there are no impending compression fractures at the adjacent levels for proceeding with a vertebroplasty. I have ordered this study.  JJP

## 2015-02-06 NOTE — Op Note (Addendum)
02/05/2015 - 02/06/2015  10:20 PM  PATIENT:  Richard Marsh  81 y.o. male  PRE-OPERATIVE DIAGNOSIS:  fracture T12  POST-OPERATIVE DIAGNOSIS:  fraacture T12  PROCEDURE:  Procedure(s): KYPHOPLASTY T12 (N/A)  SURGEON: Laurene Footman, MD  ASSISTANTS: None  ANESTHESIA:   MAC  EBL:     BLOOD ADMINISTERED:none  DRAINS: none   LOCAL MEDICATIONS USED:  MARCAINE    and XYLOCAINE   SPECIMEN:  Source of Specimen:  T12 vertebral body  DISPOSITION OF SPECIMEN:  PATHOLOGY  COUNTS:  YES  TOURNIQUET:  * No tourniquets in log *  IMPLANTS: Bone cement  DICTATION: .Dragon Dictation patient brought the operating room and after adequate sedation was given the patient was placed prone. C-arm was brought in and good visualization of T12 was obtained in both AP and lateral projections. After appropriate patient identification and timeout procedure the skin was prepped with alcohol and 5 cc 1% Xylocaine was infiltrated subcutaneously on both sides of the back was then prepped and draped in sterile fashion and repeat timeout procedure completed. The right side a spinal needle was placed onto the pedicle and local aesthetic 50-50 mix of 1% Xylocaine have percent Sensorcaine with epinephrine symmetry along the tract from the skin to the pedicle. After allowing this to set a small incision was made and a trocar advanced to the pedicle and advanced into the vertebral body checking on AP and lateral image to make sure we did not invade the neural foramen or spinal canal. Biopsy was then obtained of the vertebral body drilling was carried out and a balloon inserted and inflated to 4.5 cc with good correction of the superior endplate compression. Cement was mixed and approximate 5 cc infiltrated into the vertebral body filling the cavity created and getting good interdigitation with bilateral fill. Trochars removed and permanent C-arm views were obtained. Skin was closed with Dermabond and covered with a  Band-Aid.  PLAN OF CARE: Continues inpatient  PATIENT DISPOSITION:  PACU - hemodynamically stable.    Vomited coffee ground emesis as he was moved to his bed.  Possible aspiration.

## 2015-02-06 NOTE — Progress Notes (Signed)
[facility] NAME: Richard Marsh    MR#:  [id number]  DATE OF BIRTH:  03/31/1923  SUBJECTIVE: 81 year old male admitted because of back pain and T12 compression fracture. Seen today complaints of severe back pain with minimal ambulation. No numbness or tingling into X, no loss of bowel or bladder control.   CHIEF COMPLAINT:   Chief Complaint  Patient presents with  . Fecal Impaction    REVIEW OF SYSTEMS:    Review of Systems  Constitutional: Negative for fever and chills.  HENT: Negative for hearing loss.   Eyes: Negative for blurred vision, double vision and photophobia.  Respiratory: Negative for cough, hemoptysis and shortness of breath.   Cardiovascular: Negative for palpitations, orthopnea and leg swelling.  Gastrointestinal: Negative for vomiting, abdominal pain and diarrhea.  Genitourinary: Negative for dysuria and urgency.  Musculoskeletal: Positive for back pain. Negative for myalgias and neck pain.  Skin: Negative for rash.  Neurological: Negative for dizziness, focal weakness, seizures, weakness and headaches.  Psychiatric/Behavioral: Negative for memory loss. The patient does not have insomnia.     Nutrition:  Tolerating Diet: Tolerating PT:      DRUG ALLERGIES:   Allergies  Allergen Reactions  . Demerol [Meperidine] Anaphylaxis  . Statins Other (See Comments)    Reaction:  Unknown     VITALS:  Blood pressure 121/69, pulse 83, temperature 98 F (36.7 C), temperature source Oral, resp. rate 18, height 5\' 10"  (1.778 m), weight 71.532 kg (157 lb 11.2 oz), SpO2 97 %.  PHYSICAL EXAMINATION:   Physical Exam  GENERAL:  81 y.o.-year-old patient lying in the bed with no acute distress.  EYES: Pupils equal, round, reactive to light and accommodation. No scleral icterus. Extraocular muscles intact.  HEENT: Head atraumatic, normocephalic. Oropharynx and nasopharynx clear.  NECK:  Supple, no jugular  venous distention. No thyroid enlargement, no tenderness.  LUNGS: Normal breath sounds bilaterally, no wheezing, rales,rhonchi or crepitation. No use of accessory muscles of respiration.  CARDIOVASCULAR: S1, S2 normal. No murmurs, rubs, or gallops.  ABDOMEN: Soft, nontender, nondistended. Bowel sounds present. No organomegaly or mass.  EXTREMITIES: No pedal edema, cyanosis, or clubbing.  NEUROLOGIC: Cranial nerves II through XII are intact. Muscle strength 5/5 in all extremities. Sensation intact. Gait not checked.  PSYCHIATRIC: The patient is alert and oriented x 3.  SKIN: No obvious rash, lesion, or ulcer.    LABORATORY PANEL:   CBC  Recent Labs Lab 02/05/15 1934  WBC 14.2*  HGB 15.2  HCT 46.0  PLT 192   ------------------------------------------------------------------------------------------------------------------  Chemistries   Recent Labs Lab 02/05/15 1934 02/06/15 0540  NA 129* 133*  K 4.6 4.4  CL 96* 97*  CO2 23 27  GLUCOSE 104* 114*  BUN 20 18  CREATININE 1.16 1.16  CALCIUM 8.9 8.9  AST 24  --   ALT 9*  --   ALKPHOS 69  --   BILITOT 1.0  --    ------------------------------------------------------------------------------------------------------------------  Cardiac Enzymes  Recent Labs Lab 02/05/15 1934  TROPONINI <0.03   ------------------------------------------------------------------------------------------------------------------  RADIOLOGY:  Dg Lumbar Spine Complete  02/05/2015   CLINICAL DATA:  Low abdominal pain and lower back pain for 4 days.  EXAM: LUMBAR SPINE - COMPLETE 4+ VIEW  COMPARISON:  None.  FINDINGS: There are moderate compressions of T12 and L2. These are probably new or worsened from a lateral chest radiograph of 07/31/2012, but not necessarily acute. The other lumbar vertebrae are normal in height. Moderately  severe degenerative disc disease is present at L4-5, and there is sclerotic facet arthropathy at L5-S1, left greater than  right. There is a grade 1 retrolisthesis at L4-5 which appears to be degenerative. There is no bone lesion or bony destruction. Sacroiliac joints appear unremarkable.  IMPRESSION: Moderately severe lumbar degenerative disc and facet change, greatest at L4-5 and L5-S1.  Moderate compressions of T12 and L2 which are of indeterminate age but probably new since 07/31/2012.   Electronically Signed   By: [facility] M.D.   On: 02/05/2015 19:24   Dg Abd 1 View  02/05/2015   CLINICAL DATA:  Lower abdominal pain and low back pain.  EXAM: ABDOMEN - 1 VIEW  COMPARISON:  CT scan of the abdomen dated 04/27/2005  FINDINGS: There is prominent gas in the ascending and transverse portions of the colon but the colon is not abnormally distended. No dilated small bowel.  There are compression deformities of T12 and L2, age indeterminate.  IMPRESSION: Benign appearing abdomen. Compression fractures in the lower thoracic and upper lumbar spine, age indeterminate.   Electronically Signed   By: Lorriane Shire M.D.   On: 02/05/2015 19:24   Ct Thoracic Spine Wo Contrast  02/05/2015   CLINICAL DATA:  Compression fractures of T12 and L2.  Back pain.  EXAM: CT THORACIC SPINE WITHOUT CONTRAST  TECHNIQUE: Multidetector CT imaging of the thoracic spine was performed without intravenous contrast administration. Multiplanar CT image reconstructions were also generated.  COMPARISON:  Radiographs dated 02/05/2015  FINDINGS: There is an acute fracture of the superior endplate of Q46 with minimal protrusion of the posterior superior aspect of T12 into the spinal canal without neural impingement. There is no disc protrusion at T11-12 or T12-L1. There is slight soft tissue stranding to the right of T12.  There is also a compression fracture of the inferior endplate of T3 which is new since the prior chest x-ray of 07/31/2010. I think this fracture is subacute. There is no paraspinal soft tissue stranding.  There is slight accentuation of the  thoracic kyphosis. Remainder of the thoracic spine is normal with no disc protrusions or masses. The patient has severe chronic interstitial lung disease. Extensive coronary artery calcification. Moderate hiatal hernia.  IMPRESSION: Acute benign appearing fracture of the superior endplate of N62. Subacute benign appearing fracture of the inferior endplate of T3. No neural impingement at either level.  Severe chronic interstitial lung disease.   Electronically Signed   By: Lorriane Shire M.D.   On: 02/05/2015 20:58   Ct Lumbar Spine Wo Contrast  02/05/2015   CLINICAL DATA:  Back pain for 1 week. T12 and L2 compression fractures.  EXAM: CT LUMBAR SPINE WITHOUT CONTRAST  TECHNIQUE: Multidetector CT imaging of the lumbar spine was performed without intravenous contrast administration. Multiplanar CT image reconstructions were also generated.  COMPARISON:  Radiographs dated 02/04/2014  FINDINGS: There is an acute benign appearing fracture of the superior endplate of X52 with minimal bone protrusion into the spinal canal without neural impingement.  There is an old compression fracture of L2, healed. Prominent Schmorl's node in the superior endplate. No protrusion of bone or disc material into the spinal canal.  The patient has severe degenerative disc disease at L3-4, L4-5, and L5-S1.  L2-3:  Small broad-based disc bulge with no neural impingement.  L3-4:  Small broad-based disc bulge with no neural impingement.  L4-5: Severe right facet arthritis. Moderate right foraminal stenosis. No disc protrusion.  L5-S1: No disc protrusion or neural  impingement. Moderately severe left facet arthritis.  IMPRESSION: 1. Acute benign appearing superior endplate fracture of P61 without neural impingement. 2. Old compression fracture of L2. 3. No focal neural impingement.   Electronically Signed   By: Lorriane Shire M.D.   On: 02/05/2015 21:01     ASSESSMENT AND PLAN:   Active Problems:   UTI (lower urinary tract infection)    T12 compression fracture   Constipation   Hyponatremia  #1 UTI continue Rocephin and follow urine cultures, #2 .hyponatremia improved with fluids. #3. T12 compression fracture: Continue pain medications,ortho is following patient scheduled to have MRI of lumbosacral spine,pt is considering  Vertebroplasty continue lumbar corset, pain medications, DVT prophylaxis  Till  then.     All the records are reviewed and case discussed with Care Management/Social Workerr. Management plans discussed with the patient, family and they are in agreement.  CODE STATUS: full  TOTAL TIME TAKING CARE OF THIS PATIENT: 35inutes.   POSSIBLE D/C IN 1-2 DAYS, DEPENDING ON CLINICAL CONDITION.   Epifanio Lesches M.D on 02/06/2015 at 10:13 AM  Between 7am to 6pm - Pager - [phone]  After 6pm go to [email or web address] - password [email or web address] [facility]  [facility]  Office  [phone]  CC: Primary care physician; No primary care provider on file.

## 2015-02-06 NOTE — Anesthesia Preprocedure Evaluation (Addendum)
Anesthesia Evaluation  Patient identified by MRN, date of birth, ID band Patient awake    Reviewed: Allergy & Precautions, NPO status , Patient's Chart, lab work & pertinent test results  Airway Mallampati: II       Dental  (+) Teeth Intact   Pulmonary former smoker,    Pulmonary exam normal       Cardiovascular hypertension, Pt. on medications Normal cardiovascular exam    Neuro/Psych    GI/Hepatic negative GI ROS, Neg liver ROS,   Endo/Other  negative endocrine ROS  Renal/GU Renal InsufficiencyRenal disease     Musculoskeletal negative musculoskeletal ROS (+)   Abdominal Normal abdominal exam  (+)   Peds  Hematology negative hematology ROS (+)   Anesthesia Other Findings   Reproductive/Obstetrics                            Anesthesia Physical Anesthesia Plan  ASA: III and emergent  Anesthesia Plan: MAC   Post-op Pain Management:    Induction: Intravenous  Airway Management Planned: Nasal Cannula  Additional Equipment:   Intra-op Plan:   Post-operative Plan:   Informed Consent:   Plan Discussed with: CRNA  Anesthesia Plan Comments:         Anesthesia Quick Evaluation

## 2015-02-06 NOTE — Progress Notes (Signed)
Paged dr. Vianne Bulls re pts inability to urinate. Pt has required i/o cath twice in last 12 hours.

## 2015-02-06 NOTE — Transfer of Care (Signed)
Immediate Anesthesia Transfer of Care Note  Patient: Richard Marsh  Procedure(s) Performed: Procedure(s): KYPHOPLASTY T12 (N/A)  Patient Location: PACU  Anesthesia Type:MAC  Level of Consciousness: sedated  Airway & Oxygen Therapy: Patient Spontanous Breathing and Patient connected to face mask oxygen  Post-op Assessment: Report given to RN and Post -op Vital signs reviewed and stable  Post vital signs: Reviewed and stable  Last Vitals:  Filed Vitals:   02/06/15 2018  BP: 144/77  Pulse: 85  Temp: 36.5 C  Resp:     Complications: No apparent anesthesia complications.  Pt might have had some aspiration of some vomitus.  VSS at this time.  Surgeon aware.

## 2015-02-06 NOTE — Anesthesia Postprocedure Evaluation (Signed)
  Anesthesia Post-op Note  Patient: Richard Marsh  Procedure(s) Performed: Procedure(s): KYPHOPLASTY T12 (N/A)  Anesthesia type:MAC  Patient location: PACU  Post pain: Pain level controlled  Post assessment: Post-op Vital signs reviewed, Patient's Cardiovascular Status Stable, Respiratory Function Stable, Patent Airway and No signs of Nausea or vomiting  Post vital signs: Reviewed and stable  Last Vitals:  Filed Vitals:   02/06/15 2255  BP:   Pulse: 101  Temp:   Resp: 15    Level of consciousness: awake, alert  and patient cooperative  Complications: No apparent anesthesia complications

## 2015-02-07 ENCOUNTER — Inpatient Hospital Stay: Payer: Medicare Other

## 2015-02-07 LAB — URINE CULTURE
CULTURE: NO GROWTH
SPECIAL REQUESTS: NORMAL

## 2015-02-07 LAB — GLUCOSE, CAPILLARY: Glucose-Capillary: 142 mg/dL — ABNORMAL HIGH (ref 65–99)

## 2015-02-07 MED ORDER — IPRATROPIUM-ALBUTEROL 0.5-2.5 (3) MG/3ML IN SOLN
RESPIRATORY_TRACT | Status: AC
  Filled 2015-02-07: qty 3

## 2015-02-07 MED ORDER — SENNOSIDES-DOCUSATE SODIUM 8.6-50 MG PO TABS
1.0000 | ORAL_TABLET | Freq: Two times a day (BID) | ORAL | Status: DC
  Administered 2015-02-07 – 2015-02-13 (×12): 1 via ORAL
  Filled 2015-02-07 (×12): qty 1

## 2015-02-07 MED ORDER — HALOPERIDOL LACTATE 5 MG/ML IJ SOLN
5.0000 mg | Freq: Once | INTRAMUSCULAR | Status: AC
  Administered 2015-02-07: 5 mg via INTRAVENOUS
  Filled 2015-02-07: qty 1

## 2015-02-07 MED ORDER — LACTULOSE 10 GM/15ML PO SOLN
20.0000 g | Freq: Two times a day (BID) | ORAL | Status: DC | PRN
  Administered 2015-02-07: 20 g via ORAL
  Filled 2015-02-07: qty 30

## 2015-02-07 MED ORDER — IPRATROPIUM-ALBUTEROL 0.5-2.5 (3) MG/3ML IN SOLN
3.0000 mL | Freq: Once | RESPIRATORY_TRACT | Status: AC
  Administered 2015-02-07: 3 mL via RESPIRATORY_TRACT

## 2015-02-07 NOTE — Clinical Social Work Placement (Signed)
   CLINICAL SOCIAL WORK PLACEMENT  NOTE  Date:  02/07/2015  Patient Details  Name: Richard Marsh MRN: [id number] Date of Birth: [birth year]  Clinical Social Work is seeking post-discharge placement for this patient at the [place] level of care (*CSW will initial, date and re-position this form in  chart as items are completed):  Yes   Patient/family provided with [facility] Work Department's list of facilities offering this level of care within the geographic area requested by the patient (or if unable, by the patient's family).  Yes   Patient/family informed of their freedom to choose among providers that offer the needed level of care, that participate in Medicare, Medicaid or managed care program needed by the patient, have an available bed and are willing to accept the patient.  Yes   Patient/family informed of [facility]'s ownership interest in [facility] and [facility], as well as of the fact that they are under no obligation to receive care at these facilities.  PASRR submitted to EDS on 02/07/15     PASRR number received on 02/07/15     Existing PASRR number confirmed on       FL2 transmitted to all facilities in geographic area requested by pt/family on 02/07/15     FL2 transmitted to all facilities within larger geographic area on       Patient informed that his/her managed care company has contracts with or will negotiate with certain facilities, including the following:            Patient/family informed of bed offers received.  Patient chooses bed at       Physician recommends and patient chooses bed at      Patient to be transferred to   on  .  Patient to be transferred to facility by       Patient family notified on   of transfer.  Name of family member notified:        PHYSICIAN Please sign FL2     Additional Comment:    _______________________________________________ Mathews Argyle, LCSW 02/07/2015,  4:26 PM

## 2015-02-07 NOTE — Progress Notes (Signed)
Physical Therapy Evaluation Patient Details Name: Richard Marsh MRN: [id number] DOB: [birth year] Today's Date: 02/07/2015   History of Present Illness  Richard Marsh is a 81 y.o. male with a known history of chronic constipation, GERD with esophagitis, essential hypertension presenting with back pain. Denies any recent trauma or falls approximately 1 week prior had an episode where he apparently "slid off the couch" no pain at that time. Now describes back pain, lower back over the spine approximately 4 days worse with movement, nonradiating, "pain" quality, 8/10 intensity. Imaging  Imaging revealed T12 compression fx.  Clinical Impression  Pt ambulated 40 ft with RW and CGA for lateral unsteadiness, and second person to manage equipment. Pt is able to move from supine to sitting with use of bed rails and extended time. Pt's activity is limited by fatigue and pain. Oxygen saturation at rest is 88% on 5 liters O2. 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 Pt is able to follow instructions easily. Discussed pt's condition at length with family members who agreed to skilled nursing placement at this time.    Follow Up Recommendations SNF    Equipment Recommendations       Recommendations for Other Services       Precautions / Restrictions Restrictions Weight Bearing Restrictions: No      Mobility  Bed Mobility Overal bed mobility: Modified Independent (uses bed rails, requires extra time and effort)                Transfers Overall transfer level: Needs assistance Equipment used: Rolling walker (2 wheeled)             General transfer comment: CGA for transfers, verbal cues for sequencing sit-stand and stand-sit  Ambulation/Gait Ambulation/Gait assistance: Min guard;+2 safety/equipment Ambulation Distance (Feet): 40 Feet Assistive device: Rolling walker (2 wheeled) Gait Pattern/deviations: Decreased step length - right;Decreased step  length - left;Shuffle (Partial step-through pattern, lateral unsteadiness) Gait velocity: 0.2 Gait velocity interpretation: <1.8 ft/sec, indicative of risk for recurrent falls General Gait Details: 10 min of gait training in addition to evaluation  Stairs            Wheelchair Mobility    Modified Rankin (Stroke Patients Only)       Balance Overall balance assessment: History of Falls (neutral stance unspported 5 sec before LOB)                                           Pertinent Vitals/Pain Pain Assessment:  (5/10 at rest; 7/10 with ambulation)    Home Living Family/patient expects to be discharged to:: Skilled nursing facility Living Arrangements:  (Family has arragned for assisted living placement)                    Prior Function           Comments: Pt was living alone with 6 hrs of help per day. Pt was able to ambulate independently with RW in the home and limited community ambulation. Does not drive.     Hand Dominance        Extremity/Trunk Assessment   Upper Extremity Assessment: Overall WFL for tasks assessed (UE ROM limited somewhat by hyperkyphosis)           Lower Extremity Assessment: Overall WFL for tasks assessed      Cervical / Trunk Assessment: Kyphotic  Communication      Cognition Arousal/Alertness:  (Sleeping but easily roused) Behavior During Therapy: WFL for tasks assessed/performed Overall Cognitive Status: Within Functional Limits for tasks assessed       Memory:  (able to remember 2 of 3 items in recall test)              General Comments General comments (skin integrity, edema, etc.): Pt on 5 l O2 nasal canula. O2 sats 88% at rest, 87 after ambulation; encouraged breathing through nose    Exercises        Assessment/Plan    PT Assessment Patient needs continued PT services  PT Diagnosis Difficulty walking;Generalized weakness   PT Problem List Decreased strength;Decreased  activity tolerance;Decreased balance;Decreased mobility;Cardiopulmonary status limiting activity  PT Treatment Interventions Gait training;Stair training;Functional mobility training;Therapeutic activities;Therapeutic exercise;Balance training;Neuromuscular re-education;Patient/family education;Manual techniques   PT Goals (Current goals can be found in the Care Plan section) Acute Rehab PT Goals Patient Stated Goal: to walk PT Goal Formulation: With patient/family Potential to Achieve Goals: Good    Frequency Min 2X/week   Barriers to discharge        Co-evaluation     PT goals addressed during session: Mobility/safety with mobility;Balance         End of Session Equipment Utilized During Treatment: Gait belt;Oxygen Activity Tolerance: Patient limited by fatigue;Patient tolerated treatment well Patient left: in bed;with call bell/phone within reach;with bed alarm set;with family/visitor present (bilateral heels floated) Nurse Communication: Mobility status (02 saturation)         Time: 3557-3220 PT Time Calculation (min) (ACUTE ONLY): 40 min   Charges:   PT Evaluation $Initial PT Evaluation Tier I: 1 Procedure PT Treatments $Gait Training: 8-22 mins   PT G Codes:      Violet Baldy, PT, [place] 02/07/2015, 11:02 AM

## 2015-02-07 NOTE — Progress Notes (Addendum)
[facility] NAME: Richard Marsh    MR#:  [id number]  DATE OF BIRTH:  1923/03/25  SUBJECTIVE:  CHIEF COMPLAINT:   Chief Complaint  Patient presents with  . Fecal Impaction   Sleeping this morning. No BM for one week. Feels he has to urinate, but has catheter in. Disoriented and combative overnight.  Oriented and calm this morning.  REVIEW OF SYSTEMS:   Review of Systems  Constitutional: Negative for fever.  Respiratory: Negative for shortness of breath.   Cardiovascular: Negative for chest pain and palpitations.  Gastrointestinal: Negative for nausea, vomiting and abdominal pain.  Genitourinary: Negative for dysuria.   DRUG ALLERGIES:   Allergies  Allergen Reactions  . Demerol [Meperidine] Anaphylaxis  . Statins Other (See Comments)    Reaction:  Unknown     VITALS:  Blood pressure 106/54, pulse 88, temperature 97.5 F (36.4 C), temperature source Oral, resp. rate 16, height 5\' 10"  (1.778 m), weight 71.532 kg (157 lb 11.2 oz), SpO2 93 %.  PHYSICAL EXAMINATION:  GENERAL:  81 y.o.-year-old patient lying in the bed with no acute distress. Sleepy EYES: Pupils equal, round, reactive to light and accommodation. No scleral icterus. Extraocular muscles intact.  HEENT: Head atraumatic, normocephalic. Oropharynx and nasopharynx clear.  NECK:  Supple, no jugular venous distention. No thyroid enlargement, no tenderness.  LUNGS: Normal breath sounds bilaterally, no wheezing, rales,rhonchi or crepitation. No use of accessory muscles of respiration.  CARDIOVASCULAR: S1, S2 normal. 3/6 SEM.  No rubs, or gallops.  ABDOMEN: Soft, nontender, nondistended. Bowel sounds present. No organomegaly or mass.  EXTREMITIES: No pedal edema, cyanosis, or clubbing.  NEUROLOGIC: Cranial nerves II through XII are intact.   PSYCHIATRIC: The patient is alert and oriented x 3.  SKIN: No obvious rash, lesion, or ulcer.    LABORATORY PANEL:    CBC  Recent Labs Lab 02/05/15 1934  WBC 14.2*  HGB 15.2  HCT 46.0  PLT 192   ------------------------------------------------------------------------------------------------------------------  Chemistries   Recent Labs Lab 02/05/15 1934 02/06/15 0540  NA 129* 133*  K 4.6 4.4  CL 96* 97*  CO2 23 27  GLUCOSE 104* 114*  BUN 20 18  CREATININE 1.16 1.16  CALCIUM 8.9 8.9  AST 24  --   ALT 9*  --   ALKPHOS 69  --   BILITOT 1.0  --    ------------------------------------------------------------------------------------------------------------------  Cardiac Enzymes  Recent Labs Lab 02/05/15 1934  TROPONINI <0.03   ------------------------------------------------------------------------------------------------------------------  RADIOLOGY:  Dg Lumbar Spine Complete  02/05/2015   CLINICAL DATA:  Low abdominal pain and lower back pain for 4 days.  EXAM: LUMBAR SPINE - COMPLETE 4+ VIEW  COMPARISON:  None.  FINDINGS: There are moderate compressions of T12 and L2. These are probably new or worsened from a lateral chest radiograph of 07/31/2012, but not necessarily acute. The other lumbar vertebrae are normal in height. Moderately severe degenerative disc disease is present at L4-5, and there is sclerotic facet arthropathy at L5-S1, left greater than right. There is a grade 1 retrolisthesis at L4-5 which appears to be degenerative. There is no bone lesion or bony destruction. Sacroiliac joints appear unremarkable.  IMPRESSION: Moderately severe lumbar degenerative disc and facet change, greatest at L4-5 and L5-S1.  Moderate compressions of T12 and L2 which are of indeterminate age but probably new since 07/31/2012.   Electronically Signed   By: Andreas Newport M.D.   On: 02/05/2015 19:24   Dg Abd 1 View  02/05/2015   CLINICAL DATA:  Lower abdominal pain and low back pain.  EXAM: ABDOMEN - 1 VIEW  COMPARISON:  CT scan of the abdomen dated 04/27/2005  FINDINGS: There is prominent  gas in the ascending and transverse portions of the colon but the colon is not abnormally distended. No dilated small bowel.  There are compression deformities of T12 and L2, age indeterminate.  IMPRESSION: Benign appearing abdomen. Compression fractures in the lower thoracic and upper lumbar spine, age indeterminate.   Electronically Signed   By: Lorriane Shire M.D.   On: 02/05/2015 19:24   Ct Thoracic Spine Wo Contrast  02/05/2015   CLINICAL DATA:  Compression fractures of T12 and L2.  Back pain.  EXAM: CT THORACIC SPINE WITHOUT CONTRAST  TECHNIQUE: Multidetector CT imaging of the thoracic spine was performed without intravenous contrast administration. Multiplanar CT image reconstructions were also generated.  COMPARISON:  Radiographs dated 02/05/2015  FINDINGS: There is an acute fracture of the superior endplate of I62 with minimal protrusion of the posterior superior aspect of T12 into the spinal canal without neural impingement. There is no disc protrusion at T11-12 or T12-L1. There is slight soft tissue stranding to the right of T12.  There is also a compression fracture of the inferior endplate of T3 which is new since the prior chest x-ray of 07/31/2010. I think this fracture is subacute. There is no paraspinal soft tissue stranding.  There is slight accentuation of the thoracic kyphosis. Remainder of the thoracic spine is normal with no disc protrusions or masses. The patient has severe chronic interstitial lung disease. Extensive coronary artery calcification. Moderate hiatal hernia.  IMPRESSION: Acute benign appearing fracture of the superior endplate of V03. Subacute benign appearing fracture of the inferior endplate of T3. No neural impingement at either level.  Severe chronic interstitial lung disease.   Electronically Signed   By: Lorriane Shire M.D.   On: 02/05/2015 20:58   Ct Lumbar Spine Wo Contrast  02/05/2015   CLINICAL DATA:  Back pain for 1 week. T12 and L2 compression fractures.  EXAM:  CT LUMBAR SPINE WITHOUT CONTRAST  TECHNIQUE: Multidetector CT imaging of the lumbar spine was performed without intravenous contrast administration. Multiplanar CT image reconstructions were also generated.  COMPARISON:  Radiographs dated 02/04/2014  FINDINGS: There is an acute benign appearing fracture of the superior endplate of J00 with minimal bone protrusion into the spinal canal without neural impingement.  There is an old compression fracture of L2, healed. Prominent Schmorl's node in the superior endplate. No protrusion of bone or disc material into the spinal canal.  The patient has severe degenerative disc disease at L3-4, L4-5, and L5-S1.  L2-3:  Small broad-based disc bulge with no neural impingement.  L3-4:  Small broad-based disc bulge with no neural impingement.  L4-5: Severe right facet arthritis. Moderate right foraminal stenosis. No disc protrusion.  L5-S1: No disc protrusion or neural impingement. Moderately severe left facet arthritis.  IMPRESSION: 1. Acute benign appearing superior endplate fracture of X38 without neural impingement. 2. Old compression fracture of L2. 3. No focal neural impingement.   Electronically Signed   By: Lorriane Shire M.D.   On: 02/05/2015 21:01   Mr Lumbar Spine Wo Contrast  02/06/2015   CLINICAL DATA:  Acute compression fracture of T12.  EXAM: MRI LUMBAR SPINE WITHOUT CONTRAST  TECHNIQUE: Multiplanar, multisequence MR imaging of the lumbar spine was performed. No intravenous contrast was administered.  COMPARISON:  CT scan dated 02/05/2015  FINDINGS: Normal  conus tip at L1. Normal paraspinal soft tissues. Multiple small bilateral renal cysts.  T11-12: Subacute benign appearing compression fracture of the superior endplate of W09. 3.4 mm retropulsion of the posterior superior aspect of T12 into the spinal canal with no neural impingement. The T11-12 disc is normal.  T12-L1:  Normal.  L1-2: Old compression fracture of the superior endplate of L2 with Schmorl's node  in the superior endplate. Tiny disc protrusion into the left neural foramen without neural impingement.  L2-3: Tiny disc protrusions into the inferior inferior aspects of both neural foramina with no neural impingement.  L3-4: Small broad-based soft disc protrusion no neural impingement. Slight hypertrophy of the ligamentum flavum and facet joints.  L4-5: Disc space narrowing asymmetric to the right. Small disc protrusion slightly to the left with no neural impingement. Moderate right facet arthritis. Moderately severe right foraminal stenosis. However, the right L4 nerve exits without impingement.  L5-S1: Small disc bulge into the right neural foramen with no neural impingement. Slight left facet arthritis. Disc space narrowing to the left with moderately severe left foraminal stenosis. The left L5 nerve appears to exit without impingement.  IMPRESSION: 1. Subacute benign appearing compression fracture of the superior endplate of W11. No neural impingement.  2. Old compression fracture of L2 with a Schmorl's node. No neural impingement. 3. Left foraminal stenosis at L5-S1 and right foraminal stenosis at L4-5.   Electronically Signed   By: Lorriane Shire M.D.   On: 02/06/2015 10:18   Dg Thoracic Spine 1 View  02/06/2015   CLINICAL DATA:  Kyphoplasty  EXAM: DG C-ARM 61-120 MIN; THORACIC SPINE - 1 VIEW  COMPARISON:  MRI 02/06/2015  FINDINGS: Single intraoperative spot image demonstrates changes of single level vertebral augmentation, likely at T12. No visible complicating feature.  IMPRESSION: Vertebral augmentation, likely at T12.   Electronically Signed   By: Rolm Baptise M.D.   On: 02/06/2015 22:38   Dg C-arm 1-60 Min  02/06/2015   CLINICAL DATA:  Kyphoplasty  EXAM: DG C-ARM 61-120 MIN; THORACIC SPINE - 1 VIEW  COMPARISON:  MRI 02/06/2015  FINDINGS: Single intraoperative spot image demonstrates changes of single level vertebral augmentation, likely at T12. No visible complicating feature.  IMPRESSION:  Vertebral augmentation, likely at T12.   Electronically Signed   By: Rolm Baptise M.D.   On: 02/06/2015 22:38    EKG:   Orders placed or performed in visit on 05/25/13  . EKG 12-Lead    ASSESSMENT AND PLAN:   1) UTI - urine culture negative so far - rocephin day 3/5  2) hyponatremia - improving  3) T12 compression fracture:  - s/p kyphoplasty. Pain greatly reduced.  4) delirium - likely post operative effect with underlying dementia - remove foley, decrease discomfort  5) constipation - Dr. Marry Guan has changed orders  6) dispo - SNF search - continue PT  7) hematemasis/coffee ground emesis - no further events. Monitor CBC  CODE STATUS: full  TOTAL TIME TAKING CARE OF THIS PATIENT: 35 minutes.  Greater than 50% of time spent in care coordination and counseling.All the records are reviewed and case discussed with Care Management/Social Workerr. Management plans discussed with the patient, nursing and Dr. Marry Guan  POSSIBLE D/C IN 2 DAYS, DEPENDING ON CLINICAL CONDITION.   Myrtis Ser M.D on 02/07/2015 at 11:44 AM  Between 7am to 6pm - Pager - [phone]  After 6pm go to [email or web address] - password [email or web address] [facility]  [facility]  Office  [phone]  CC: Primary  care physician; No primary care provider on file.

## 2015-02-07 NOTE — Progress Notes (Signed)
Dr.Hower notified change in patients baseline No new orders giving. See Flow sheet. Nursing will continue to monitor.

## 2015-02-07 NOTE — Progress Notes (Signed)
Dr. Marcille Blanco came to assess patient, new orders giving. Patient alert and oriented. Patient  HOB elevated and non re breather in place. Patient O2 stats WNL.

## 2015-02-07 NOTE — Consult Note (Signed)
Orthopaedics-  POD #1 - T12 kyphoplasty  Patient reports improvement with back pain. His primary complaint is constipation (no BM for 1 week).  Awake and alert.  Neurovascularly intact. Homan test negative.  Lactulose prn ordered. Colace changed to Senokot S bid Begin PT today.  James P. Holley Bouche M.D.

## 2015-02-07 NOTE — Progress Notes (Signed)
Patient confused, angry, and physically combative. Nursing at bedside. Patient trying to get out of bed. Re- orientation giving and comfort. Pain controlled with PRN medication. Dr. Marcille Blanco notified and new orders giving.

## 2015-02-07 NOTE — Clinical Social Work Note (Signed)
Clinical Social Work Assessment  Patient Details  Name: Richard Marsh MRN: [id number] Date of Birth: 05-08-1923  Date of referral:  02/07/15               Reason for consult:  Facility Placement                Permission sought to share information with:  Facility Sport and exercise psychologist, Family Supports Permission granted to share information::  No (pt was sleeping and would not wake for CSW)  Name::        Agency::     Relationship::     Contact Information:     Housing/Transportation Living arrangements for the past 2 months:  Single Family Home Source of Information:  Adult Children Patient Interpreter Needed:  None Criminal Activity/Legal Involvement Pertinent to Current Situation/Hospitalization:  No - Comment as needed Significant Relationships:  Adult Children Lives with:  Facility Resident (New ALF facility resident) Do you feel safe going back to the place where you live?    Need for family participation in patient care:  Yes (Comment)  Care giving concerns:  Pt's family is concerned that they will not get into the facility of their choice due to limited options on the weekend.  CSW will f/u with offers once they have been received.   Social Worker assessment / plan:  CSW spoke to pt's family.  They would like to get pt to SNF if possible.  If no CSW is attempting to contact ALF [facility].  Pt was scheduled to become a resident at this facility on Monday.   CSW is attempting to contact them to determine if they can assess pt this weekend for possible DC tomorrow if pt is medically stable.  CSW was give permission from pt's daughter Richard Marsh [phone] to do bed search for [place] facilities.     Employment status:  Retired Forensic scientist:  Medicare PT Recommendations:  [place] / Referral to community resources:  [place]  Patient/Family's Response to care:  Pt's family would like [facility] or [facility],  however CSW explained that neither of these facilities do weekend DC.  Family still agreed to bed search.  CSW will f/u with offers tomorrow.  Patient/Family's Understanding of and Emotional Response to Diagnosis, Current Treatment, and Prognosis:  Pt's family verbalized understanding of SNF DC and available options.  They may not choose a facility to DC to.  Emotional Assessment Appearance:  Appears stated age Attitude/Demeanor/Rapport:    Affect (typically observed):    Orientation:    Alcohol / Substance use:  Never Used Psych involvement (Current and /or in the community):  No (Comment)  Discharge Needs  Concerns to be addressed:  Care Coordination Readmission within the last 30 days:  No Current discharge risk:  Physical Impairment Barriers to Discharge:  No Barriers Identified   Mathews Argyle, LCSW 02/07/2015, 11:04 AM

## 2015-02-07 NOTE — Progress Notes (Signed)
Patient and family wanted to hold off on PO meds due to patient n/v. Patient denies n/v. Patient states he feels better. Medication giving see MAR.

## 2015-02-08 ENCOUNTER — Inpatient Hospital Stay: Payer: Medicare Other

## 2015-02-08 ENCOUNTER — Encounter: Payer: Self-pay | Admitting: Orthopedic Surgery

## 2015-02-08 DIAGNOSIS — J690 Pneumonitis due to inhalation of food and vomit: Secondary | ICD-10-CM | POA: Diagnosis not present

## 2015-02-08 DIAGNOSIS — E871 Hypo-osmolality and hyponatremia: Secondary | ICD-10-CM

## 2015-02-08 DIAGNOSIS — K5900 Constipation, unspecified: Secondary | ICD-10-CM

## 2015-02-08 DIAGNOSIS — J9600 Acute respiratory failure, unspecified whether with hypoxia or hypercapnia: Secondary | ICD-10-CM | POA: Diagnosis not present

## 2015-02-08 DIAGNOSIS — M4854XS Collapsed vertebra, not elsewhere classified, thoracic region, sequela of fracture: Secondary | ICD-10-CM

## 2015-02-08 DIAGNOSIS — J84112 Idiopathic pulmonary fibrosis: Secondary | ICD-10-CM | POA: Diagnosis present

## 2015-02-08 LAB — URINALYSIS COMPLETE WITH MICROSCOPIC (ARMC ONLY)
Bilirubin Urine: NEGATIVE
Glucose, UA: NEGATIVE mg/dL
LEUKOCYTES UA: NEGATIVE
NITRITE: NEGATIVE
Protein, ur: NEGATIVE mg/dL
Specific Gravity, Urine: 1.011 (ref 1.005–1.030)
pH: 5 (ref 5.0–8.0)

## 2015-02-08 LAB — CBC
HCT: 42.8 % (ref 40.0–52.0)
HEMOGLOBIN: 14.1 g/dL (ref 13.0–18.0)
MCH: 29.9 pg (ref 26.0–34.0)
MCHC: 33 g/dL (ref 32.0–36.0)
MCV: 90.5 fL (ref 80.0–100.0)
PLATELETS: 204 10*3/uL (ref 150–440)
RBC: 4.73 MIL/uL (ref 4.40–5.90)
RDW: 14.6 % — ABNORMAL HIGH (ref 11.5–14.5)
WBC: 22.1 10*3/uL — AB (ref 3.8–10.6)

## 2015-02-08 LAB — BASIC METABOLIC PANEL
Anion gap: 11 (ref 5–15)
BUN: 22 mg/dL — ABNORMAL HIGH (ref 6–20)
CALCIUM: 8.5 mg/dL — AB (ref 8.9–10.3)
CO2: 21 mmol/L — AB (ref 22–32)
CREATININE: 1.34 mg/dL — AB (ref 0.61–1.24)
Chloride: 99 mmol/L — ABNORMAL LOW (ref 101–111)
GFR calc Af Amer: 52 mL/min — ABNORMAL LOW (ref >60)
GFR, EST NON AFRICAN AMERICAN: 45 mL/min — AB (ref >60)
Glucose, Bld: 120 mg/dL — ABNORMAL HIGH (ref 65–99)
Potassium: 3.9 mmol/L (ref 3.5–5.1)
Sodium: 131 mmol/L — ABNORMAL LOW (ref 135–145)

## 2015-02-08 LAB — BLOOD GAS, ARTERIAL
ACID-BASE DEFICIT: 2.9 mmol/L — AB (ref 0.0–2.0)
Allens test (pass/fail): POSITIVE — AB
Bicarbonate: 20.9 mEq/L — ABNORMAL LOW (ref 21.0–28.0)
FIO2: 100
O2 Saturation: 93.7 %
Patient temperature: 37
pCO2 arterial: 33 mmHg (ref 32.0–48.0)
pH, Arterial: 7.41 (ref 7.350–7.450)
pO2, Arterial: 69 mmHg — ABNORMAL LOW (ref 83.0–108.0)

## 2015-02-08 MED ORDER — LEVOFLOXACIN IN D5W 500 MG/100ML IV SOLN
500.0000 mg | INTRAVENOUS | Status: DC
  Administered 2015-02-08: 500 mg via INTRAVENOUS
  Filled 2015-02-08 (×3): qty 100

## 2015-02-08 MED ORDER — CETYLPYRIDINIUM CHLORIDE 0.05 % MT LIQD
7.0000 mL | Freq: Two times a day (BID) | OROMUCOSAL | Status: DC
  Administered 2015-02-09 – 2015-02-10 (×4): 7 mL via OROMUCOSAL

## 2015-02-08 MED ORDER — ENSURE ENLIVE PO LIQD
237.0000 mL | Freq: Two times a day (BID) | ORAL | Status: DC
  Administered 2015-02-08 – 2015-02-09 (×2): 237 mL via ORAL

## 2015-02-08 MED ORDER — PIPERACILLIN-TAZOBACTAM 3.375 G IVPB
3.3750 g | Freq: Three times a day (TID) | INTRAVENOUS | Status: DC
  Administered 2015-02-08 – 2015-02-11 (×9): 3.375 g via INTRAVENOUS
  Filled 2015-02-08 (×15): qty 50

## 2015-02-08 MED ORDER — CHLORHEXIDINE GLUCONATE 0.12 % MT SOLN
15.0000 mL | Freq: Two times a day (BID) | OROMUCOSAL | Status: DC
  Administered 2015-02-08 – 2015-02-13 (×7): 15 mL via OROMUCOSAL
  Filled 2015-02-08: qty 15

## 2015-02-08 MED ORDER — METHYLPREDNISOLONE SODIUM SUCC 125 MG IJ SOLR
60.0000 mg | Freq: Four times a day (QID) | INTRAMUSCULAR | Status: DC
  Administered 2015-02-08 – 2015-02-09 (×5): 60 mg via INTRAVENOUS
  Filled 2015-02-08 (×5): qty 2

## 2015-02-08 MED ORDER — IPRATROPIUM-ALBUTEROL 0.5-2.5 (3) MG/3ML IN SOLN
3.0000 mL | RESPIRATORY_TRACT | Status: DC
  Administered 2015-02-08 – 2015-02-09 (×7): 3 mL via RESPIRATORY_TRACT
  Filled 2015-02-08 (×7): qty 3

## 2015-02-08 NOTE — Assessment & Plan Note (Signed)
-  Status post kyphoplasty on 02/06/2015

## 2015-02-08 NOTE — Assessment & Plan Note (Signed)
-  On review of his imaging. This appears to be chronic and is likely been present for some time. Review of CT chest shows changes consistent with idiopathic pulmonary fibrosis as well as traction bronchiectasis.

## 2015-02-08 NOTE — Progress Notes (Signed)
Dr Volanda Napoleon notified of chest xray, O2 sats still 92-93% on non rebreather, Dr Volanda Napoleon ordered Bipap and transfer to ICU. Patient stable at the tine. Continue to monitor.

## 2015-02-08 NOTE — Assessment & Plan Note (Addendum)
-  The patient appears to have severe pulmonary fibrosis changes on CT of his chest. He probably had some degree of chronic respiratory failure before the procedure, which is now complicated by acute respiratory failure from pneumonia and/or atelectasis. -We'll attempt to change the patient from BiPAP to high flow oxygen cannula.

## 2015-02-08 NOTE — Consult Note (Signed)
[facility] Consultation  Thank you, Dr. Volanda Napoleon for this kind referral, my impression is below.   ASSESSMENT / PLAN: T12 compression fracture -Status post kyphoplasty on 02/06/2015  Aspiration pneumonia -The patient's oxygen saturations decreased soon after the surgery. This may be secondary to aspiration versus healthcare associated pneumonia. -Will continue broad-spectrum antibiotics as well as high-dose steroids and monitor the patient's progress.  Acute respiratory failure -The patient appears to have severe pulmonary fibrosis changes on CT of his chest. He probably had some degree of chronic respiratory failure before the procedure, which is now complicated by acute respiratory failure from pneumonia and/or atelectasis. -We'll attempt to change the patient from BiPAP to high flow oxygen cannula.  Idiopathic pulmonary fibrosis -On review of his imaging. This appears to be chronic and is likely been present for some time. Review of CT chest shows changes consistent with idiopathic pulmonary fibrosis as well as traction bronchiectasis.     ---------------------------------------   Name: Richard Marsh MRN: [id number] DOB: June 30, 1923    ADMISSION DATE:  02/05/2015 CONSULTATION DATE:  02/08/15  REFERRING MD :  Dr. Volanda Napoleon   CHIEF COMPLAINT:  Dyspnea   HISTORY OF PRESENT ILLNESS:    The patient is a 81 year old Caucasian male. He is admitted to the hospital with increasing lower back pain is found that he had a T12 compression fracture. Currently the patient is on full facial BiPAP and therefore the history that he is able to provide his limited. He underwent a kyphoplasty on 729 review the records, the patient had an O2 sat of approximately 94-95% on room air at the time of admission. Subsequently, on the evening after the kyphoplasty. He required increasingly  higher flows of oxygen. Subsequently, overnight he has been titrated up to a nonrebreather mask and  subsequently BiPAP and was then moved up to the intensive care unit for further monitoring and treatment. He is currently on a BiPAP device appears to be breathing comfortably only though he does appear to be dyspneic.Marland Kitchen   PAST MEDICAL HISTORY :  Past Medical History  Diagnosis Date  . Cancer   . Renal disorder    Past Medical History  Diagnosis Date  . Unspecified essential hypertension  . Coronary atherosclerosis of unspecified type of vessel, native or graft  . Migraine, unspecified, without mention of intractable migraine without mention of status migrainosus  . Depressive disorder, not elsewhere classified  . Coronary atherosclerosis of native coronary artery  . Essential hypertension, benign  . Esophageal reflux  . Barrett esophagus  . Cataract  . Arthritis  . Bone spur  right shoulder  . Anxiety  . Hiatal hernia  . Colon polyps  . PVC's (premature ventricular contractions)  . Macular degeneration   Past Surgical History  Procedure Laterality Date  . Coronary angioplasty  . Hernia repair  . Cataract extraction  . 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 Colonoscopy 05/12/2004  . Egd 07/06/2004, 07/30/2004, 04/25/2005, 04/15/2009   History reviewed. No pertinent past surgical history. Prior to Admission medications   Medication Sig Start Date End Date Taking? Authorizing Provider  amLODipine-benazepril (LOTREL) 5-10 MG per capsule Take 1 capsule by mouth daily.   Yes Historical Provider, MD  aspirin EC 81 MG tablet Take 81 mg by mouth daily.   Yes Historical Provider, MD  doxepin (SINEQUAN) 25 MG capsule Take 25 mg by mouth at bedtime.   Yes Historical Provider, MD  Linaclotide Rolan Lipa) 145 MCG CAPS capsule  Take 145 mcg by mouth daily.   Yes Historical Provider, MD  LORazepam (ATIVAN) 1 MG tablet Take 1 mg by mouth 3 (three) times daily.   Yes Historical Provider, MD  omeprazole (PRILOSEC) 20 MG capsule Take 20 mg by mouth 2 (two) times daily.   Yes  Historical Provider, MD   Allergies  Allergen Reactions  . Demerol [Meperidine] Anaphylaxis  . Statins Other (See Comments)    Reaction:  Unknown     FAMILY HISTORY:  Family History  Problem Relation Age of Onset  . Heart failure Neg Hx    SOCIAL HISTORY:  reports that he has quit smoking. He does not have any smokeless tobacco history on file. He reports that he does not drink alcohol. His drug history is not on file.  REVIEW OF SYSTEMS:   The patient is currently on a BiPAP and cannot provide a complete review of systems.   VITAL SIGNS: Temp:  [98.4 F (36.9 C)-99.5 F (37.5 C)] 99 F (37.2 C) (07/31 0744) Pulse Rate:  [88-111] 105 (07/31 0744) Resp:  [16-18] 18 (07/31 0744) BP: (97-147)/(55-84) 147/55 mmHg (07/31 0744) SpO2:  [89 %-95 %] 95 % (07/31 1058) HEMODYNAMICS:   VENTILATOR SETTINGS:   INTAKE / OUTPUT:  Intake/Output Summary (Last 24 hours) at 02/08/15 1127 Last data filed at 02/08/15 0932  Gross per 24 hour  Intake    600 ml  Output   1976 ml  Net  -1376 ml    Physical Examination:   VS: BP 147/55 mmHg  Pulse 105  Temp(Src) 99 F (37.2 C) (Oral)  Resp 18  Ht 5\' 10"  (1.778 m)  Wt 71.532 kg (157 lb 11.2 oz)  BMI 22.63 kg/m2  SpO2 95%  General Appearance: No distress  Neuro:without focal findings, mental status. HEENT: PERRLA, EOM intact. Pulmonary: normal breath sounds., diaphragmatic excursion normal. CardiovascularNormal S1,S2.  No m/r/g.    Abdomen: Benign, Soft, non-tender. Renal:  No costovertebral tenderness  GU:  No performed at this time. Endoc: No evident thyromegaly, no signs of acromegaly or Cushing features Skin:   warm, no rashes, no ecchymosis  Extremities: normal, no cyanosis, clubbing.   LABS:  CBC  Recent Labs Lab 02/05/15 1934 02/08/15 0353  WBC 14.2* 22.1*  HGB 15.2 14.1  HCT 46.0 42.8  PLT 192 204   Coag's No results for input(s): APTT, INR in the last 168 hours. BMET  Recent Labs Lab 02/05/15 1934  02/06/15 0540 02/08/15 0353  NA 129* 133* 131*  K 4.6 4.4 3.9  CL 96* 97* 99*  CO2 23 27 21*  BUN 20 18 22*  CREATININE 1.16 1.16 1.34*  GLUCOSE 104* 114* 120*   Electrolytes  Recent Labs Lab 02/05/15 1934 02/06/15 0540 02/08/15 0353  CALCIUM 8.9 8.9 8.5*   Sepsis Markers No results for input(s): LATICACIDVEN, PROCALCITON, O2SATVEN in the last 168 hours. ABG  Recent Labs Lab 02/08/15 1010  PHART 7.41  PCO2ART 33  PO2ART 69*   Liver Enzymes  Recent Labs Lab 02/05/15 1934  AST 24  ALT 9*  ALKPHOS 69  BILITOT 1.0  ALBUMIN 3.9   Cardiac Enzymes  Recent Labs Lab 02/05/15 1934  TROPONINI <0.03   Glucose  Recent Labs Lab 02/07/15 0018  GLUCAP 142*    Imaging Dg Chest Port 1 View  02/08/2015   CLINICAL DATA:  Hypoxia.  Difficulty breathing.  EXAM: PORTABLE CHEST - 1 VIEW  COMPARISON:  02/07/2015  FINDINGS: Diffuse chronic interstitial prominence throughout the lungs with  low lung volumes. Findings most compatible with severe chronic lung disease/fibrosis it is difficult to completely exclude an acute process, particularly in the left lower lobe but there appears to be a focal opacity. This is unchanged. Cardiomegaly.  IMPRESSION: Coarsened diffuse interstitial prominence throughout the lungs, likely chronic interstitial lung disease / fibrosis. Questionable superimposed consolidation in the left lower lobe. Cannot exclude pneumonia.   Electronically Signed   By: Rolm Baptise M.D.   On: 02/08/2015 10:14   Dg Chest Port 1 View  02/07/2015   CLINICAL DATA:  Hypoxia  EXAM: PORTABLE CHEST - 1 VIEW  COMPARISON:  07/31/2012  FINDINGS: Low lung volumes.  Subpleural reticulation/ fibrosis in the bilateral lungs, suggesting chronic interstitial lung disease.  Mild left lower lobe opacity, favored to reflect atelectasis/scarring. Pneumonia is considered less likely.  The heart is normal in size.  IMPRESSION: Chronic interstitial lung disease.  Superimposed left lower lobe  opacity, likely atelectasis/scarring, pneumonia not excluded.   Electronically Signed   By: Julian Hy M.D.   On: 02/07/2015 12:07    STUDIES:  I did review the patient's most recent imaging films. Reports as well as his most recent CT of the chest. These would indicate severe subpleural fibrosis which appears to be consistent with idiopathic pulmonary fibrosis.  --Marda Stalker, MD.  Board Certified in Internal Medicine, Pulmonary Medicine, [place], and Sleep Medicine.  Pager [phone] [facility] Office Number: [phone]  Patricia Pesa, M.D.  Vilinda Boehringer, M.D.  Cheral Marker, M.D  [place].  I have personally obtained a history, examined the patient, evaluated laboratory and imaging results, formulated the assessment and plan and placed orders. The Patient requires high complexity decision making for assessment and support, frequent evaluation and titration of therapies, application of advanced monitoring technologies and extensive interpretation of multiple databases. The patient has critical illness that could lead imminently to failure of 1 or more organ systems and requires the highest level of physician preparedness to intervene.  Critical Care Time devoted to patient care services described in this note is 35 minutes and is exclusive of time spent in procedures.    02/08/2015, 11:27 AM

## 2015-02-08 NOTE — Progress Notes (Signed)
Report called to Teresita in [place], orderly called to transfer patient. Continue to monitor.

## 2015-02-08 NOTE — Progress Notes (Signed)
[facility] [place] NAME: Richard Marsh    MR#:  [id number]  DATE OF BIRTH:  07-29-1922  SUBJECTIVE:  CHIEF COMPLAINT:   Chief Complaint  Patient presents with  . Fecal Impaction   Has had respiratory decompensation today. Initially on nonrebreather on the floor but sats in the high 80s even on this. Transferred to stepdown unit for BiPAP. Now transitioned to high flow nasal cannula. He seems quite uncomfortable, fatigued.  REVIEW OF SYSTEMS:   Review of Systems  Constitutional: Positive for malaise/fatigue. Negative for fever.  Respiratory: Positive for cough, sputum production, shortness of breath and wheezing.   Cardiovascular: Negative for chest pain and palpitations.  Gastrointestinal: Negative for nausea, vomiting and abdominal pain.  Genitourinary: Negative for dysuria.  Neurological: Positive for weakness.    DRUG ALLERGIES:   Allergies  Allergen Reactions  . Demerol [Meperidine] Anaphylaxis  . Statins Other (See Comments)    Reaction:  Unknown     VITALS:  Blood pressure 126/61, pulse 102, temperature 99.5 F (37.5 C), temperature source Axillary, resp. rate 26, height 5\' 10"  (1.778 m), weight 71.532 kg (157 lb 11.2 oz), SpO2 95 %.  PHYSICAL EXAMINATION:  GENERAL:  81 y.o.-year-old patient lying in the bed thin, short of breath, uncomfortable.  EYES: Pupils equal, round, reactive to light and accommodation. No scleral icterus. Extraocular muscles intact.  HEENT: Head atraumatic, normocephalic. Oropharynx and nasopharynx clear.  NECK:  Supple, no jugular venous distention. No thyroid enlargement, no tenderness. Mucous membranes are dry LUNGS: Short shallow respirations, diffuse wheezing and rhonchi, poor air movement CARDIOVASCULAR: S1, S2 normal. No murmurs, rubs, or gallops. Tachycardic ABDOMEN: Soft, nontender, nondistended. Bowel sounds present. No organomegaly or mass.  EXTREMITIES: No pedal edema, cyanosis,  or clubbing.  NEUROLOGIC: Cranial nerves II through XII are grossly intact.   PSYCHIATRIC: The patient is alert and oriented x 3.  SKIN: No obvious rash, lesion, or ulcer.    LABORATORY PANEL:   CBC  Recent Labs Lab 02/08/15 0353  WBC 22.1*  HGB 14.1  HCT 42.8  PLT 204   ------------------------------------------------------------------------------------------------------------------  Chemistries   Recent Labs Lab 02/05/15 1934  02/08/15 0353  NA 129*  < > 131*  K 4.6  < > 3.9  CL 96*  < > 99*  CO2 23  < > 21*  GLUCOSE 104*  < > 120*  BUN 20  < > 22*  CREATININE 1.16  < > 1.34*  CALCIUM 8.9  < > 8.5*  AST 24  --   --   ALT 9*  --   --   ALKPHOS 69  --   --   BILITOT 1.0  --   --   < > = values in this interval not displayed. ------------------------------------------------------------------------------------------------------------------  Cardiac Enzymes  Recent Labs Lab 02/05/15 1934  TROPONINI <0.03   ------------------------------------------------------------------------------------------------------------------  RADIOLOGY:  Dg Thoracic Spine 1 View  02/06/2015   CLINICAL DATA:  Kyphoplasty  EXAM: DG C-ARM 61-120 MIN; THORACIC SPINE - 1 VIEW  COMPARISON:  MRI 02/06/2015  FINDINGS: Single intraoperative spot image demonstrates changes of single level vertebral augmentation, likely at T12. No visible complicating feature.  IMPRESSION: Vertebral augmentation, likely at T12.   Electronically Signed   By: Rolm Baptise M.D.   On: 02/06/2015 22:38   Dg Chest Port 1 View  02/08/2015   CLINICAL DATA:  Hypoxia.  Difficulty breathing.  EXAM: PORTABLE CHEST - 1 VIEW  COMPARISON:  02/07/2015  FINDINGS: Diffuse chronic interstitial prominence throughout the lungs with low lung volumes. Findings most compatible with severe chronic lung disease/fibrosis it is difficult to completely exclude an acute process, particularly in the left lower lobe but there appears to be a  focal opacity. This is unchanged. Cardiomegaly.  IMPRESSION: Coarsened diffuse interstitial prominence throughout the lungs, likely chronic interstitial lung disease / fibrosis. Questionable superimposed consolidation in the left lower lobe. Cannot exclude pneumonia.   Electronically Signed   By: Rolm Baptise M.D.   On: 02/08/2015 10:14   Dg Chest Port 1 View  02/07/2015   CLINICAL DATA:  Hypoxia  EXAM: PORTABLE CHEST - 1 VIEW  COMPARISON:  07/31/2012  FINDINGS: Low lung volumes.  Subpleural reticulation/ fibrosis in the bilateral lungs, suggesting chronic interstitial lung disease.  Mild left lower lobe opacity, favored to reflect atelectasis/scarring. Pneumonia is considered less likely.  The heart is normal in size.  IMPRESSION: Chronic interstitial lung disease.  Superimposed left lower lobe opacity, likely atelectasis/scarring, pneumonia not excluded.   Electronically Signed   By: Julian Hy M.D.   On: 02/07/2015 12:07   Dg C-arm 1-60 Min  02/06/2015   CLINICAL DATA:  Kyphoplasty  EXAM: DG C-ARM 61-120 MIN; THORACIC SPINE - 1 VIEW  COMPARISON:  MRI 02/06/2015  FINDINGS: Single intraoperative spot image demonstrates changes of single level vertebral augmentation, likely at T12. No visible complicating feature.  IMPRESSION: Vertebral augmentation, likely at T12.   Electronically Signed   By: Rolm Baptise M.D.   On: 02/06/2015 22:38    EKG:   Orders placed or performed in visit on 05/25/13  . EKG 12-Lead    ASSESSMENT AND PLAN:    1) acute respiratory failure: He has been moved to the stepdown unit for BiPAP, now on high flow nasal cannula. Appreciate pulmonology consultation. He does have a long history of pulmonary fibrosis but has never been on oxygen or CPAP in the past. Has not had any treatment for his pulmonary fibrosis. Currently treating with Solu-Medrol, nebulizers. There is also a spot in the left lower lobe which could possibly be pneumonia, blood cultures obtained sputum  cultures ordered treated with Zosyn.  2) UTI: Urine culture negative. Rocephin day 4, now transitioned to Zosyn as above  3) hyponatremia: Fairly stable  4) T12 compression fracture:  - s/p kyphoplasty. Pain greatly reduced.  5) delirium: Oriented at the time of this examination. Did have some disorientation overnight possibly due to hypoxia, dementia.  6) constipation: Resolved  7) hematemasis/coffee ground emesis - no further events. Monitor CBC, stable so far  All the records are reviewed and case discussed with Care Management/Social Workerr. Management plans discussed with the patient, family and they are in agreement.  CODE STATUS: DO NOT INTUBATE, limited resuscitation. This was discussed with the patient and his son at the bedside.  TOTAL TIME TAKING CARE OF THIS PATIENT: 45 minutes.  Greater than 50% of time spent in care coordination and counseling. POSSIBLE D/C IN 3-4 DAYS, DEPENDING ON CLINICAL CONDITION.   Myrtis Ser M.D on 02/08/2015 at 2:16 PM  Between 7am to 6pm - Pager - [phone]  After 6pm go to [email or web address] - password [email or web address] [facility]  [facility]  Office  [phone]  CC: Primary care physician; No primary care provider on file.

## 2015-02-08 NOTE — Assessment & Plan Note (Addendum)
-  The patient's oxygen saturations decreased soon after the surgery. This may be secondary to aspiration versus healthcare associated pneumonia. -Will continue broad-spectrum antibiotics as well as high-dose steroids and monitor the patient's progress.

## 2015-02-08 NOTE — Consult Note (Signed)
Orthopaedics- POD #2 - T12  kyphoplasty  Patient complains of some back pain this AM. He is on a nonrebreather mask to maintain O2 sats in the 90's. (+) BM   Lungs: Bilateral rales with end expiratory wheezing Spine: Some tenderness to palpation along the mid back. No spasms.  PT notes reviewed. Continue PT. Awaiting placement for SND.

## 2015-02-08 NOTE — Clinical Documentation Improvement (Signed)
Please document the suspected or known cause(s) of the patient's T12 compression fracture, including any associated condition(s).  Patient admitted with a T12 compression fracture now status post T12 kyphoplasty.  Patient reports an episode approximately 1 week prior to admission where he "slid off the couch" with no pain per the H&P.   Thank You, Erling Conte ,RN Clinical Documentation Specialist:  [phone] [place] [facility]

## 2015-02-09 ENCOUNTER — Inpatient Hospital Stay: Payer: Medicare Other

## 2015-02-09 ENCOUNTER — Inpatient Hospital Stay
Admit: 2015-02-09 | Discharge: 2015-02-09 | Disposition: A | Discharge status: Home / Self Care | Payer: Medicare Other | Attending: Internal Medicine | Admitting: Internal Medicine

## 2015-02-09 DIAGNOSIS — J8410 Pulmonary fibrosis, unspecified: Secondary | ICD-10-CM

## 2015-02-09 DIAGNOSIS — J9601 Acute respiratory failure with hypoxia: Secondary | ICD-10-CM

## 2015-02-09 LAB — BASIC METABOLIC PANEL
ANION GAP: 7 (ref 5–15)
BUN: 26 mg/dL — ABNORMAL HIGH (ref 6–20)
CALCIUM: 8.4 mg/dL — AB (ref 8.9–10.3)
CO2: 24 mmol/L (ref 22–32)
CREATININE: 1.21 mg/dL (ref 0.61–1.24)
Chloride: 102 mmol/L (ref 101–111)
GFR, EST AFRICAN AMERICAN: 59 mL/min — AB (ref >60)
GFR, EST NON AFRICAN AMERICAN: 51 mL/min — AB (ref >60)
Glucose, Bld: 164 mg/dL — ABNORMAL HIGH (ref 65–99)
POTASSIUM: 4.3 mmol/L (ref 3.5–5.1)
Sodium: 133 mmol/L — ABNORMAL LOW (ref 135–145)

## 2015-02-09 LAB — EXPECTORATED SPUTUM ASSESSMENT W GRAM STAIN, RFLX TO RESP C

## 2015-02-09 LAB — CBC
HEMATOCRIT: 35.3 % — AB (ref 40.0–52.0)
Hemoglobin: 11.9 g/dL — ABNORMAL LOW (ref 13.0–18.0)
MCH: 30 pg (ref 26.0–34.0)
MCHC: 33.6 g/dL (ref 32.0–36.0)
MCV: 89.1 fL (ref 80.0–100.0)
Platelets: 185 10*3/uL (ref 150–440)
RBC: 3.96 MIL/uL — AB (ref 4.40–5.90)
RDW: 14.3 % (ref 11.5–14.5)
WBC: 11 10*3/uL — ABNORMAL HIGH (ref 3.8–10.6)

## 2015-02-09 LAB — EXPECTORATED SPUTUM ASSESSMENT W REFEX TO RESP CULTURE: SPECIAL REQUESTS: NORMAL

## 2015-02-09 MED ORDER — DILTIAZEM HCL 100 MG IV SOLR
5.0000 mg/h | INTRAVENOUS | Status: DC
  Administered 2015-02-09: 5 mg/h via INTRAVENOUS
  Filled 2015-02-09 (×2): qty 100

## 2015-02-09 MED ORDER — DILTIAZEM HCL 25 MG/5ML IV SOLN
5.0000 mg | Freq: Once | INTRAVENOUS | Status: AC
  Administered 2015-02-09: 5 mg via INTRAVENOUS
  Filled 2015-02-09: qty 5

## 2015-02-09 MED ORDER — LORAZEPAM 1 MG PO TABS: 1.0000 mg | ORAL_TABLET | Freq: Three times a day (TID) | ORAL | Status: DC | PRN

## 2015-02-09 MED ORDER — FUROSEMIDE 10 MG/ML IJ SOLN
20.0000 mg | Freq: Once | INTRAMUSCULAR | Status: AC
  Administered 2015-02-09: 20 mg via INTRAVENOUS
  Filled 2015-02-09: qty 2

## 2015-02-09 MED ORDER — DILTIAZEM HCL 60 MG PO TABS
60.0000 mg | ORAL_TABLET | Freq: Four times a day (QID) | ORAL | Status: DC
  Administered 2015-02-09 – 2015-02-11 (×8): 60 mg via ORAL
  Filled 2015-02-09 (×8): qty 1

## 2015-02-09 MED ORDER — METHYLPREDNISOLONE SODIUM SUCC 40 MG IJ SOLR
40.0000 mg | Freq: Two times a day (BID) | INTRAMUSCULAR | Status: DC
  Administered 2015-02-09 – 2015-02-10 (×2): 40 mg via INTRAVENOUS
  Filled 2015-02-09 (×2): qty 1

## 2015-02-09 MED ORDER — ENOXAPARIN SODIUM 40 MG/0.4ML ~~LOC~~ SOLN
40.0000 mg | SUBCUTANEOUS | Status: DC
  Administered 2015-02-10 – 2015-02-13 (×4): 40 mg via SUBCUTANEOUS
  Filled 2015-02-09 (×4): qty 0.4

## 2015-02-09 MED ORDER — IPRATROPIUM-ALBUTEROL 0.5-2.5 (3) MG/3ML IN SOLN
3.0000 mL | Freq: Four times a day (QID) | RESPIRATORY_TRACT | Status: DC
  Administered 2015-02-09 – 2015-02-13 (×14): 3 mL via RESPIRATORY_TRACT
  Filled 2015-02-09 (×15): qty 3

## 2015-02-09 MED ORDER — DILTIAZEM HCL 100 MG IV SOLR
5.0000 mg/h | INTRAVENOUS | Status: DC
  Administered 2015-02-09: 10 mg/h via INTRAVENOUS
  Filled 2015-02-09: qty 100

## 2015-02-09 NOTE — Progress Notes (Signed)
   02/09/15 1615  Clinical Encounter Type  Visited With Family  Visit Type Initial;Spiritual support  Referral From Care management  Consult/Referral To Chaplain  Spiritual Encounters  Spiritual Needs Emotional  Stress Factors  Patient Stress Factors Exhausted;Family relationships;Health changes;Major life changes  Family Stress Factors Exhausted;Family relationships;Major life changes  Met w/son to provide emotional and spiritual support. Will follow up w/patient after Physical Therapy. Chap. Mykalah Saari G. [place]

## 2015-02-09 NOTE — Care Management Important Message (Signed)
Important Message  Patient Details  Name: Richard Marsh MRN: [id number] Date of Birth: Mar 14, 1923   Medicare Important Message Given:  Yes-second notification given    Juliann Pulse A Allmond 02/09/2015, 10:36 AM

## 2015-02-09 NOTE — Progress Notes (Signed)
cardizem gtt d/c per MD order, po cardizem started d/c gtt appox 2hrs after po given

## 2015-02-09 NOTE — Progress Notes (Signed)
[facility] [place] NAME: Richard Marsh    MR#:  [id number]  DATE OF BIRTH:  Aug 11, 1922  SUBJECTIVE:  CHIEF COMPLAINT:   Chief Complaint  Patient presents with  . Fecal Impaction   Continues to have significant respiratory distress. Is on high flow nasal cannula with oxygen saturations in the low 90s.  REVIEW OF SYSTEMS:   Review of Systems  Constitutional: Positive for malaise/fatigue. Negative for fever.  Respiratory: Positive for cough, sputum production, shortness of breath and wheezing.   Cardiovascular: Negative for chest pain and palpitations.  Gastrointestinal: Negative for nausea, vomiting and abdominal pain.  Genitourinary: Negative for dysuria.  Neurological: Positive for weakness.    DRUG ALLERGIES:   Allergies  Allergen Reactions  . Demerol [Meperidine] Anaphylaxis  . Statins Other (See Comments)    Reaction:  Unknown     VITALS:  Blood pressure 101/62, pulse 99, temperature 98.3 F (36.8 C), temperature source Oral, resp. rate 32, height 5\' 10"  (1.778 m), weight 71.532 kg (157 lb 11.2 oz), SpO2 93 %.  PHYSICAL EXAMINATION:  GENERAL:  81 y.o.-year-old patient lying in the bed thin, short of breath, uncomfortable. High flow nasal cannula EYES: Pupils equal, round, reactive to light and accommodation. No scleral icterus. Extraocular muscles intact.  HEENT: Head atraumatic, normocephalic. Oropharynx and nasopharynx clear.  NECK:  Supple, no jugular venous distention. No thyroid enlargement, no tenderness. Mucous membranes are dry LUNGS: Short shallow respirations, diffuse wheezing and rhonchi, poor air movement CARDIOVASCULAR: S1, S2 normal. No murmurs, rubs, or gallops. Tachycardic irregular ABDOMEN: Soft, nontender, nondistended. Bowel sounds present. No organomegaly or mass.  EXTREMITIES: No pedal edema, cyanosis, or clubbing.  NEUROLOGIC: Cranial nerves II through XII are grossly intact.   PSYCHIATRIC:  The patient is alert and oriented x 3.  SKIN: No obvious rash, lesion, or ulcer.    LABORATORY PANEL:   CBC  Recent Labs Lab 02/09/15 0406  WBC 11.0*  HGB 11.9*  HCT 35.3*  PLT 185   ------------------------------------------------------------------------------------------------------------------  Chemistries   Recent Labs Lab 02/05/15 1934  02/09/15 0406  NA 129*  < > 133*  K 4.6  < > 4.3  CL 96*  < > 102  CO2 23  < > 24  GLUCOSE 104*  < > 164*  BUN 20  < > 26*  CREATININE 1.16  < > 1.21  CALCIUM 8.9  < > 8.4*  AST 24  --   --   ALT 9*  --   --   ALKPHOS 69  --   --   BILITOT 1.0  --   --   < > = values in this interval not displayed. ------------------------------------------------------------------------------------------------------------------  Cardiac Enzymes  Recent Labs Lab 02/05/15 1934  TROPONINI <0.03   ------------------------------------------------------------------------------------------------------------------  RADIOLOGY:  Dg Chest 1 View  02/09/2015   CLINICAL DATA:  Acute respiratory failure, aspiration pneumonia, history of pulmonary fibrosis and previous tobacco use  EXAM: CHEST  1 VIEW  COMPARISON:  Portable chest x-ray of February 08, 2015  FINDINGS: The lungs are slightly better inflated today. There persistent diffusely increased interstitial densities. There is no pleural effusion. The cardiac silhouette is enlarged but stable. The pulmonary vascularity is indistinct. There is no pleural effusion or pneumothorax. The observed bony thorax exhibits no acute abnormality.  IMPRESSION: Slight interval improvement in the aeration of the lungs with slight decreased prominence of interstitial densities. This may reflect improving interstitial pneumonia superimposed upon chronic pulmonary fibrosis.  Electronically Signed   By: David  Martinique M.D.   On: 02/09/2015 07:30   Dg Chest Port 1 View  02/08/2015   CLINICAL DATA:  Hypoxia.  Difficulty breathing.   EXAM: PORTABLE CHEST - 1 VIEW  COMPARISON:  02/07/2015  FINDINGS: Diffuse chronic interstitial prominence throughout the lungs with low lung volumes. Findings most compatible with severe chronic lung disease/fibrosis it is difficult to completely exclude an acute process, particularly in the left lower lobe but there appears to be a focal opacity. This is unchanged. Cardiomegaly.  IMPRESSION: Coarsened diffuse interstitial prominence throughout the lungs, likely chronic interstitial lung disease / fibrosis. Questionable superimposed consolidation in the left lower lobe. Cannot exclude pneumonia.   Electronically Signed   By: Rolm Baptise M.D.   On: 02/08/2015 10:14   Dg Chest Port 1 View  02/07/2015   CLINICAL DATA:  Hypoxia  EXAM: PORTABLE CHEST - 1 VIEW  COMPARISON:  07/31/2012  FINDINGS: Low lung volumes.  Subpleural reticulation/ fibrosis in the bilateral lungs, suggesting chronic interstitial lung disease.  Mild left lower lobe opacity, favored to reflect atelectasis/scarring. Pneumonia is considered less likely.  The heart is normal in size.  IMPRESSION: Chronic interstitial lung disease.  Superimposed left lower lobe opacity, likely atelectasis/scarring, pneumonia not excluded.   Electronically Signed   By: Julian Hy M.D.   On: 02/07/2015 12:07    EKG:   Orders placed or performed in visit on 05/25/13  . EKG 12-Lead    ASSESSMENT AND PLAN:    1) acute respiratory failure with hypoxia due to pulmonary fibrosis, healthcare associated pneumonia, concern for aspiration pneumonia - Pulmonology consultation appreciated - Continue with high flow oxygen via nasal cannula, Solu-Medrol, nebulizers, Zosyn - Also question possible volume overload with rapid improvement of chest x-ray between yesterday and today. We will provide additional Lasix today and get a 2-D echocardiogram - Speech therapy to see due to concern for aspiration  2) healthcare associated pneumonia - Blood cultures,  sputum cultures pending - Chest x-ray improved  3) UTI: Urine culture negative. Rocephin day 4, now transitioned to Zosyn as above  4) hyponatremia: Continues to improve  5) T12 compression fracture:  - s/p kyphoplasty. Pain greatly reduced.  6) delirium: Resolved.  7) atrial fibrillation with rapid ventricular rate - Started overnight, likely due to respiratory distress - Continue Cardizem drip with goal heart rate less than 100   CODE STATUS: DO NOT INTUBATE, limited resuscitation. This was discussed with the patient and his son at the bedside. Will ask for palliative care consultation.  TOTAL CRIT EQUAL CARE TIME TAKING CARE OF THIS PATIENT: 35 minutes.   POSSIBLE D/C IN 3-4 DAYS, DEPENDING ON CLINICAL CONDITION.   Myrtis Ser M.D on 02/09/2015 at 11:29 AM  Between 7am to 6pm - Pager - [phone]  After 6pm go to [email or web address] - password [email or web address] [facility]  [facility]  Office  [phone]  CC: Primary care physician; No primary care provider on file.

## 2015-02-09 NOTE — Evaluation (Signed)
Clinical/Bedside Swallow Evaluation Patient Details  Name: Richard Marsh MRN: [id number] Date of Birth: Jan 15, 1923  Today's Date: 02/09/2015 Time: SLP Start Time (ACUTE ONLY): 107 SLP Stop Time (ACUTE ONLY): 1455 SLP Time Calculation (min) (ACUTE ONLY): 60 min  Past Medical History:  Past Medical History  Diagnosis Date  . Cancer   . Renal disorder    Past Surgical History:  Past Surgical History  Procedure Laterality Date  . Kyphoplasty N/A 02/06/2015    Procedure: KYPHOPLASTY T12;  Surgeon: Hessie Knows, MD;  Location: [facility] ORS;  Service: Orthopedics;  Laterality: N/A;   HPI:  Pt is a 81 y.o. male with a known history of chronic constipation, pulmonary fibrosis, GERD with esophagitis, Barrett's Esophagus, essential hypertension presenting with back pain. Denies any recent trauma or falls approximately 1 week prior had an episode where he apparently "slid off the couch" no pain at that time. Now describes back pain, lower back over the spine approximately 4 days worse with movement, nonradiating, "pain" quality, 8/10 intensity. Son reported pt's oral intake heavily consists of drinking Ensures at home(pt does have Reflux per his report; Barrett's Esophagus). Pt had kyphoplasty done post admission followed by declined respiratory status w/ distress and increased support needs; afib w/ rapid ventricular rate being tx'd by MD. Pt appears weak and easily SOB w/ exertion.    Assessment / Plan / Recommendation Clinical Impression  Pt appears to present w/ min. increased risk for aspiration sec. to declined respiratory status and need for increased O2 support at this time. Pt was able to safely tolerate trials of thin liquids, purees and a bolus of mech soft food w/ no immediate, overt s/s of aspiration noted following strict aspiration precautions including smaller, fewer bites/sips w/ rest breaks to avoid increased WOB/SOB. Pt's O2 remained 92-95% during po trials following precautions. Pt helped  to feed self but required few verbal cues to slow down and take breaks. Pt consumed all pills whole in puree w/ NSG/SLP w/out difficulty. Pt is rec'd to take a mech soft diet w/ thin liquids w/ NO straws; aspiration precautions; meds in puree. ST will f/u for further education w/ pt/family/staff.     Aspiration Risk  Mild    Diet Recommendation Dysphagia 3 (Mech soft);Thin   Medication Administration: Whole meds with puree Compensations: Slow rate;Small sips/bites (rest breaks)    Other  Recommendations Recommended Consults:  (Dietician f/u; Ensure) Oral Care Recommendations: Oral care BID;Oral care before and after PO;Staff/trained caregiver to provide oral care   Follow Up Recommendations       Frequency and Duration min 3x week  1 week   Pertinent Vitals/Pain denied    SLP Swallow Goals  see care plan    Swallow Study Prior Functional Status   resides at home w/ family support    General Date of Onset: 02/05/15 Other Pertinent Information: Pt is a 81 y.o. male with a known history of chronic constipation, pulmonary fibrosis, GERD with esophagitis, Barrett's Esophagus, essential hypertension presenting with back pain. Denies any recent trauma or falls approximately 1 week prior had an episode where he apparently "slid off the couch" no pain at that time. Now describes back pain, lower back over the spine approximately 4 days worse with movement, nonradiating, "pain" quality, 8/10 intensity. Son reported pt's oral intake heavily consists of drinking Ensures at home(pt does have Reflux per his report; Barrett's Esophagus). Pt had kyphoplasty done post admission followed by declined respiratory status w/ distress and increased support needs; afib  w/ rapid ventricular rate being tx'd by MD. Pt appears weak and easily SOB w/ exertion.  Type of Study: Bedside swallow evaluation Previous Swallow Assessment: none Diet Prior to this Study: Regular;Thin liquids Temperature Spikes Noted: No  (wbc 11.0 down from 22.1 yesterday) Respiratory Status:  (HFNC 45%) History of Recent Intubation: No Behavior/Cognition: Alert;Cooperative;Pleasant mood (distracted w/ other issues ongoing(palliative care consult)) Oral Cavity - Dentition: Missing dentition Self-Feeding Abilities: Able to feed self;Needs assist;Needs set up (pacing to avoid increased WOB) Patient Positioning: Upright in bed Baseline Vocal Quality: Normal;Low vocal intensity (easily SOB) Volitional Cough: Strong Volitional Swallow: Able to elicit    Oral/Motor/Sensory Function Overall Oral Motor/Sensory Function: Appears within functional limits for tasks assessed Labial ROM: Within Functional Limits Labial Symmetry: Within Functional Limits Labial Strength: Within Functional Limits Lingual ROM: Within Functional Limits Lingual Symmetry: Within Functional Limits Lingual Strength: Within Functional Limits Facial Symmetry: Within Functional Limits Mandible: Within Functional Limits   Ice Chips Ice chips: Within functional limits Presentation:  (fed; x3)   Thin Liquid Thin Liquid: Within functional limits Presentation: Cup;Self Fed (~8 ozs total) Other Comments: few sips; rest breaks during; smaller sips    Nectar Thick Nectar Thick Liquid: Not tested   Honey Thick Honey Thick Liquid: Not tested   Puree Puree: Within functional limits Presentation: Self Fed;Spoon (assisted at times; ~6-7 ozs total) Other Comments: rest breaks   Solid   GO    Solid: Within functional limits Presentation: Self Fed (small bolus x1) Other Comments: min. increased mastication time but grossly wfl; increased exertion (min.)      Orinda Kenner, MS, CCC-SLP  Angel Hobdy 02/09/2015,4:23 PM

## 2015-02-09 NOTE — Progress Notes (Signed)
Dr. Marcille Blanco notified of EKG change to Afib rate 94-104.  Continue to monitor.

## 2015-02-09 NOTE — Progress Notes (Signed)
Cardizem 5mg  IVP given for A fib rate 120's

## 2015-02-09 NOTE — Consult Note (Signed)
Palliative Medicine Inpatient Consult Note   Name: Richard Marsh Date: 02/09/2015 MRN: [id number]  DOB: [birth year]  Referring Physician: [name], MD  Palliative Care consult requested for this 81 y.o. male for goals of medical therapy in patient with acute respiratory failure with hypoxia (on high flow oxygen currently but not on oxygen at home), pulmonary fibrosis, pulmonary edema, healthcare associated pneumonia, hyponatremia, delerium now resolved, and osteoporosis with recent T12 compression fracture treated with kyphoplasty.  He was originally admitted with unbearable lower back pain which developed after he slid off the couch a week prior to admission.  A Ct scan confirmed a subacute T12 compression fracture. A urinalysis done 02/08/15 showed no leucocyte esterasy or nitirites, but 3 plus hematuria was noted with rare bacteria and 0-5 WBCs.  He was thought to have a UTI at time of admission and was started on ABX which have been adjusted.  A urine culture sent 02/08/15 is negative for UTI (but this was obtained after he was here for several days already on ABX).  Blood cxs are negative.  He developed respiratory distress following the kyphoplasty procedure and had been on high flow oxygen with sats only running in the 80's last night.  He was diuresed and an echo is ordered to evaluate his cardiac function.  His rhythm changed to Afib with rapid response and he is on a Cardizem drip with HR currently 80 - 120.  He is talking more and his sats are up in the 90's at the time of this initial consult visit.      TODAY'S MANAGEMENT, RECOMMENDATIONS, AND PLAN: Patient has a visitor currently and he wants to continue talking with him. [place].  Pt is talking nearly continuously with his nephew as I walk in the room.  I see no distress or conversational dyspnea , but he is on high flow oxygen via nasal route and his sats are jumping  around between 90 and 94%.  He is also getting a 2D echo performed at this time.    I spoke with him briefly about my role as part of the team that cares for him.  He tells me he has four children and Richard Marsh and Richard Marsh are his two health care POAs.  There is are no documents related to POA/ HCPOA in the paper chart, so will need to see if we can obtain these (since pt had altered sensorium for a few days --this becomes very important).   He is currently on an antiarrythmic(Cardizem for AFib RVR) and I believe this may be the reason for the LIMITED CODE (eseentially DNI).  But, at time of discharge to a facility, code status should be simplified to full code or no code --as this is the only option honored by EMS, etc by [employer].  This current code status is for while he is here.  He will likely transition from a cardizem drip to oral rate controlling meds and there will be some consideration regarding the pros and cons of anticoagulation prior to discharge also. Echo results are pending as echo was just performed.    At first, I was unable to talk with him due to all the activity going on. I returned to find his son, Richard Marsh, in the room.  i was able to touch on the code status issue and wishes for aggressive vs less aggressive care.  Some of this was  driven by the patient who told me he doesn't want a lot of things done at his age.  But, he was imprecise and inconsistent and I could also tell he was getting tired of a lot of activity, visitors, and talking (though he had been doing much of the talking today). No decisions were made today, but the subject of a Full DNR (at time of discharge) was brought up as well as the issues related to his heart and lung problems and things that might be done for these conditions.  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  He seems to be trying to communicate that he does not want to have aggressive care and he certainly  does not want futile care, but he got a bit tangential and also a bit inconsistent in his speech and so there are to be no changes made to his current DNI 'limited code' status.   I spoke with ST who is to evaluate patient.  She and I have found records showing that he has Barretts, anorexia, and wt loss for a number of years.    I have reviewed  MD and Pt notes.   I reviewed Care Mgt notes and called the Care Mgr.    We discussed the patients current critical illness and anticipated discharge to a SNF.  He was prior to this admission a new ALF resident (at [facility]).   It should be noted that HIGH FLOW OXYGEN can be a great help but it aslo should be seen as an indicator of critical illness and hypoxia and as such, it represents a very sick patient who has  a barrier to discharge since it is an indicator of someone who is still quite hypoxic and ill.    He is on about 65% hi flow and talking easily, so I suspect he will be able to be weaned off of Hi Flow oxygen and then, as he recovers from the HCAP. Aspiration Pnuemonia and the pulmonary edema and as his HR normalizes or converts, his attending physician will determine a safe discharge date for the patient.  I have been informed that patient's are usually expected to be on 6 LPM (or less) for 24 hours before discharge. I will confirm this.   After he is down to around 40% HI FLOW, he can begin to be changed to normal flow of oxygen.     Will follow with you and pursue supportive discussions with patient and family.  No changes to current code status, but we do need to be aware that he did express wishes to not have 'too much done' at his age.    Apparently, there is a POA form but no HCPOA form.  He could have a HCPOA and Living Will completed very quickly if her were interested.  He is tired now, however, and did not pick up on my hints about how this could be facilitated.  Son, Richard Marsh, says he has been out of town and does not know all the details  about his father's conditions.      REVIEW OF SYSTEMS:  Says he is in no pain and is breathing better.  No fever or chills or nausea or vomiting. Has a cough and is aware when his heart rate is very fast.  No confusion or speech or swallowing troubles.  No rash.  Balance of ROS difficult to obtain since he is  getting a procedure (echo) and also talking with nephew while I attempt an initial visit.    SPIRITUAL SUPPORT SYSTEM: Yes --adult children and other relatives.  SOCIAL HISTORY:  reports that he has quit smoking. He does not have any smokeless tobacco history on file. He reports that he does not drink alcohol. He was a new resident in an ALF but SNF is desired at time of DC.   LEGAL DOCUMENTS:  No documents are in the paper chart. Two sons are purportedly the POAs (per pt) but pt says he DOES NOT HAVE A HCPOA form or Living Will form completed.  In the absence of a HCPOA form --decisions about patient's health care falls to the [facility] if he becomes unable to make his own health care decisions.     CODE STATUS: Limited Code (DNI)  PAST MEDICAL HISTORY: Past Medical History  Diagnosis Date  . Cancer   . Renal disorder     PAST SURGICAL HISTORY:  Past Surgical History  Procedure Laterality Date  . Kyphoplasty N/A 02/06/2015    Procedure: KYPHOPLASTY T12;  Surgeon: Hessie Knows, MD;  Location: [facility] ORS;  Service: Orthopedics;  Laterality: N/A;    ALLERGIES:  is allergic to demerol and statins.  MEDICATIONS:  Current Facility-Administered Medications  Medication Dose Route Frequency Provider Last Rate Last Dose  . acetaminophen (TYLENOL) tablet 650 mg  650 mg Oral Q6H PRN Lytle Butte, MD       Or  . acetaminophen (TYLENOL) suppository 650 mg  650 mg Rectal Q6H PRN Lytle Butte, MD      . amLODipine (NORVASC) tablet 5 mg  5 mg Oral Daily Lytle Butte, MD   5 mg at 02/08/15 0258   And  . benazepril (LOTENSIN) tablet 10 mg  10 mg Oral Daily Lytle Butte,  MD   10 mg at 02/08/15 5277  . antiseptic oral rinse (CPC / CETYLPYRIDINIUM CHLORIDE 0.05%) solution 7 mL  7 mL Mouth Rinse q12n4p Lytle Butte, MD      . aspirin EC tablet 81 mg  81 mg Oral Daily Lytle Butte, MD   81 mg at 02/08/15 8242  . bisacodyl (DULCOLAX) suppository 10 mg  10 mg Rectal Daily PRN Hessie Knows, MD      . chlorhexidine (PERIDEX) 0.12 % solution 15 mL  15 mL Mouth Rinse BID Lytle Butte, MD   15 mL at 02/09/15 0800  . diltiazem (CARDIZEM) 100 mg in dextrose 5 % 100 mL (1 mg/mL) infusion  5-15 mg/hr Intravenous Titrated Harrie Foreman, MD 10 mL/hr at 02/09/15 0950 10 mg/hr at 02/09/15 0950  . doxepin (SINEQUAN) capsule 25 mg  25 mg Oral QHS Lytle Butte, MD   25 mg at 02/08/15 2139  . feeding supplement (ENSURE ENLIVE) (ENSURE ENLIVE) liquid 237 mL  237 mL Oral BID BM Richard Jewett, MD   237 mL at 02/08/15 1444  . ipratropium-albuterol (DUONEB) 0.5-2.5 (3) MG/3ML nebulizer solution 3 mL  3 mL Nebulization Q6H Wilhelmina Mcardle, MD      . lactulose (CHRONULAC) 10 GM/15ML solution 20 g  20 g Oral BID PRN Dereck Leep, MD   20 g at 02/07/15 0919  . Linaclotide (LINZESS) capsule 145 mcg  145 mcg Oral Daily Lytle Butte, MD   145 mcg at 02/08/15 3536  . LORazepam (ATIVAN) tablet 1 mg  1 mg Oral TID Lytle Butte, MD   1  mg at 02/08/15 2138  . magnesium hydroxide (MILK OF MAGNESIA) suspension 30 mL  30 mL Oral Daily PRN Lytle Butte, MD   30 mL at 02/06/15 0506  . methylPREDNISolone sodium succinate (SOLU-MEDROL) 40 mg/mL injection 40 mg  40 mg Intravenous Q12H Wilhelmina Mcardle, MD      . morphine 2 MG/ML injection 2 mg  2 mg Intravenous Q4H PRN Lytle Butte, MD   2 mg at 02/08/15 1113  . ondansetron (ZOFRAN) tablet 4 mg  4 mg Oral Q6H PRN Lytle Butte, MD       Or  . ondansetron [facility]) injection 4 mg  4 mg Intravenous Q6H PRN Lytle Butte, MD   4 mg at 02/06/15 2006  . oxyCODONE (Oxy IR/ROXICODONE) immediate release tablet 5 mg  5 mg Oral Q4H PRN Lytle Butte, MD    5 mg at 02/08/15 0804  . pantoprazole (PROTONIX) injection 40 mg  40 mg Intravenous Q24H Lytle Butte, MD   40 mg at 02/08/15 2139  . piperacillin-tazobactam (ZOSYN) IVPB 3.375 g  3.375 g Intravenous 3 times per day Laverle Hobby, MD   3.375 g at 02/09/15 0604  . senna-docusate (Senokot-S) tablet 1 tablet  1 tablet Oral BID Dereck Leep, MD   1 tablet at 02/08/15 2139    Vital Signs: BP 119/61 mmHg  Pulse 92  Temp(Src) 98.8 F (37.1 C) (Oral)  Resp 31  Ht 5\' 10"  (1.778 m)  Wt 71.532 kg (157 lb 11.2 oz)  BMI 22.63 kg/m2  SpO2 95% Filed Weights   02/05/15 1742 02/05/15 2304  Weight: 72.576 kg (160 lb) 71.532 kg (157 lb 11.2 oz)    Estimated body mass index is 22.63 kg/(m^2) as calculated from the following:   Height as of this encounter: 5\' 10"  (1.778 m).   Weight as of this encounter: 71.532 kg (157 lb 11.2 oz).  PERFORMANCE STATUS (ECOG) : 3 - Symptomatic, >50% confined to bed currently (but not prior to admission)    PHYSICAL EXAM:  NAD Talking without conversational dyspnea EOMI   OP clear Neck NO JVD seen   And No TM Heart irreg irreg no murmurs heard Lungs with ronchi bilaterally but no wheezing or rales are hears Abd soft and NT with nl BS Sacrum has a protecting type of dressing to prevent breakdown Ext no cyanosis or clubbing Skin is warm, pale, and dry He is alert and oriented and talking normally with appropriate answers to questions (though he is a bit tangential)   LABS: CBC:    Component Value Date/Time   WBC 11.0* 02/09/2015 0406   WBC 9.9 05/25/2013 0346   HGB 11.9* 02/09/2015 0406   HGB 13.6 05/25/2013 0346   HCT 35.3* 02/09/2015 0406   HCT 40.5 05/25/2013 0346   PLT 185 02/09/2015 0406   PLT 223 05/25/2013 0346   MCV 89.1 02/09/2015 0406   MCV 89 05/25/2013 0346   NEUTROABS 9.0* 02/05/2015 1934   LYMPHSABS 3.4 02/05/2015 1934   MONOABS 1.3* 02/05/2015 1934   EOSABS 0.3 02/05/2015 1934   BASOSABS 0.1 02/05/2015 1934   Comprehensive  Metabolic Panel:    Component Value Date/Time   NA 133* 02/09/2015 0406   NA 133* 05/25/2013 0346   K 4.3 02/09/2015 0406   K 4.2 05/25/2013 0346   CL 102 02/09/2015 0406   CL 103 05/25/2013 0346   CO2 24 02/09/2015 0406   CO2 24 05/25/2013 0346   BUN 26* 02/09/2015  0406   BUN 18 05/25/2013 0346   CREATININE 1.21 02/09/2015 0406   CREATININE 1.53* 05/25/2013 0346   GLUCOSE 164* 02/09/2015 0406   GLUCOSE 100* 05/25/2013 0346   CALCIUM 8.4* 02/09/2015 0406   CALCIUM 8.9 05/25/2013 0346   AST 24 02/05/2015 1934   AST 18 07/31/2012 0341   ALT 9* 02/05/2015 1934   ALT 17 07/31/2012 0341   ALKPHOS 69 02/05/2015 1934   ALKPHOS 77 07/31/2012 0341   BILITOT 1.0 02/05/2015 1934   BILITOT 0.4 07/31/2012 0341   PROT 7.7 02/05/2015 1934   PROT 7.3 07/31/2012 0341   ALBUMIN 3.9 02/05/2015 1934   ALBUMIN 3.6 07/31/2012 0341    IMPRESSION: 1)  Acute respiratory failure with hypoxia 2) Pulmonary Fibrosis (Chronic Idiopathic Pulmonary Fibrosis with traction Bronchiectasis) --not previously on home oxygenor CPAP and not treated with meds 3) Possible Aspiration Pneumonia (per Pulmonologist who noted that pts oxygen sats dropped soon after the kyphoplasty he had after admission) VS HCAP 4) Osteoporosis with subacute T12 compression fracture causing the pain which brought him here and resulting in the kyphoplasty which has greatly reduced his lower back pain.  5) Urinary retention --new since admission?  6) Constipation-treated and resolved ( he had fecal impaction at admission apparently) 7) Hyponatremia --stable 8) Metabolic Encephalopathy --resolved 9) Afib with RVR --ehco just done  --on Cardizem drip for rate control --apparently new as he has not been followed by a cardiologist nor has he been told he has 'Afib' 10) CAD and stent placement --remotely (not followed by any cardiologists) 10) History of skin cancers (only basal cells per pt) 11) History of Barrett's Esophagus and  Esophageal Stricture Dilation  (has been seen by Dr. Candace Cruise, Gastroenterologist)  12) Wt loss and decreased appetite since 2007 documented in outpatient notes (as shown to me by ST) 13) History of colon polyps   See Above for PLAN   REFERRALS TO BE ORDERED:  None.     More than 50% of the visit was spent in counseling/coordination of care: Yes  Time Spent: 60 minutes

## 2015-02-09 NOTE — Progress Notes (Signed)
Physical Therapy Treatment Patient Details Name: Richard Marsh MRN: [id number] DOB: 1923-01-18 Today's Date: 02/09/2015    History of Present Illness Richard Marsh is a 81 y.o. male with a known history of chronic constipation, GERD with esophagitis, essential hypertension presenting with back pain. Denies any recent trauma or falls approximately 1 week prior had an episode where he apparently "slid off the couch" no pain at that time. Now describes back pain, lower back over the spine approximately 4 days worse with movement, nonradiating, "pain" quality, 8/10 intensity. Imaging  Imaging revealed T12 compression fx.    PT Comments    Patient with recent transfer to CCU due to respiratory distress (due to possible aspiration PNA vs. HCAP) and a-fib with RVR.  Orders to continue PT upon transfer noted. Patient agreeable to session, but limited to bed-level only due to fatigue.  Vitals stable and WFL (SaO2 >94% on HFNC and HR 90-110s with therex) throughout session.  Will plan to attempt OOB activity next date as medically appropriate and patient tolerates.   Follow Up Recommendations  SNF     Equipment Recommendations       Recommendations for Other Services       Precautions / Restrictions Precautions Precautions: Fall;Back Restrictions Weight Bearing Restrictions: No    Mobility  Bed Mobility               General bed mobility comments: Unable to tolerate due to fatigue  Transfers                    Ambulation/Gait                 Stairs            Wheelchair Mobility    Modified Rankin (Stroke Patients Only)       Balance                                    Cognition                            Exercises Other Exercises Other Exercises: Supine LE therex, 1x15, AROM for muscular strength/endurance: ankle pumps, heel slides, hip abduct/adduct, SAQs.  Intermittent rest periods as needed.  Notably fatigued;  unable to tolerate additional activity/mobility at this time.  Increased ease of movement noted with R vs L LE (patient reports this is normal for him)    General Comments        Pertinent Vitals/Pain      Home Living                      Prior Function            PT Goals (current goals can now be found in the care plan section) Acute Rehab PT Goals Patient Stated Goal: to walk PT Goal Formulation: With patient/family Time For Goal Achievement: 02/21/15 Potential to Achieve Goals: Good Progress towards PT goals: Not progressing toward goals - comment (unable to tolerate OOB mobility this date)    Frequency  Min 2X/week    PT Plan Current plan remains appropriate    Co-evaluation             End of Session   Activity Tolerance: Patient limited by fatigue Patient left: in bed;with call bell/phone within reach;with family/visitor present  Time: 6578-4696 PT Time Calculation (min) (ACUTE ONLY): 21 min  Charges:  $Therapeutic Exercise: 8-22 mins                    G Codes:      Richard Marsh, PT, DPT, NCS 02/09/2015, 4:55 PM [phone]

## 2015-02-09 NOTE — Progress Notes (Signed)
C/O general malaise. No new complaints otherwise. Remains on high flow [place] O2   MICRO DATA: Urine 7/28 >> NEG Resp 8/01 >>  Blood 7/31 >>    ANTIMICROBIALS:  Pip-tazo 8/01 >>    Filed Vitals:   02/09/15 0848 02/09/15 0900 02/09/15 1100 02/09/15 1200  BP:  117/55 101/62 119/61  Pulse:  103 99 92  Temp:  98.3 F (36.8 C) 98.8 F (37.1 C)   TempSrc:  Oral    Resp:  21 32 31  Height:      Weight:      SpO2: 92% 96% 93% 95%   NAD No JVD Diffuse bilateral crackles, no wheezes IRIR, mildly tachycardic, no M noted NABS, Soft, NT Ext warm without edema  BMET    Component Value Date/Time   NA 133* 02/09/2015 0406   NA 133* 05/25/2013 0346   K 4.3 02/09/2015 0406   K 4.2 05/25/2013 0346   CL 102 02/09/2015 0406   CL 103 05/25/2013 0346   CO2 24 02/09/2015 0406   CO2 24 05/25/2013 0346   GLUCOSE 164* 02/09/2015 0406   GLUCOSE 100* 05/25/2013 0346   BUN 26* 02/09/2015 0406   BUN 18 05/25/2013 0346   CREATININE 1.21 02/09/2015 0406   CREATININE 1.53* 05/25/2013 0346   CALCIUM 8.4* 02/09/2015 0406   CALCIUM 8.9 05/25/2013 0346   GFRNONAA 51* 02/09/2015 0406   GFRNONAA 40* 05/25/2013 0346   GFRAA 59* 02/09/2015 0406   GFRAA 46* 05/25/2013 0346    CBC    Component Value Date/Time   WBC 11.0* 02/09/2015 0406   WBC 9.9 05/25/2013 0346   RBC 3.96* 02/09/2015 0406   RBC 4.56 05/25/2013 0346   HGB 11.9* 02/09/2015 0406   HGB 13.6 05/25/2013 0346   HCT 35.3* 02/09/2015 0406   HCT 40.5 05/25/2013 0346   PLT 185 02/09/2015 0406   PLT 223 05/25/2013 0346   MCV 89.1 02/09/2015 0406   MCV 89 05/25/2013 0346   MCH 30.0 02/09/2015 0406   MCH 29.9 05/25/2013 0346   MCHC 33.6 02/09/2015 0406   MCHC 33.7 05/25/2013 0346   RDW 14.3 02/09/2015 0406   RDW 14.1 05/25/2013 0346   LYMPHSABS 3.4 02/05/2015 1934   MONOABS 1.3* 02/05/2015 1934   EOSABS 0.3 02/05/2015 1934   BASOSABS 0.1 02/05/2015 1934    CXR: acute on chronic interstitial prominence somewhat improved.   Echocardiogram: performed. Results pending  IMPRESSION: Acute hypoxic respiratory failure Suspect chronic ILD/ pulm fibrosis based on prior CXRs Concern for aspiration - chronic aspiration can be a cause of pulm fibrosis  Consider component of pulm edema AFRVR - rate adequately controlled on diltiazem gtt  PLAN/REC: Cont O2 titrated to maintain SpO2 90-95% Cont current abx and narrow as able SLP eval ordered If able to take PO meds, transition from IV to PO diltiazem Poor candidate for long term anticoagulation  Agree with Palliative Care input to address goals of care It is likely that he is to the point where he cannot thrive @ home  Merton Border, MD ; [facility] service Mobile [phone].  After 5:30 PM or weekends, call [phone]

## 2015-02-10 ENCOUNTER — Inpatient Hospital Stay: Payer: Medicare Other

## 2015-02-10 DIAGNOSIS — J9601 Acute respiratory failure with hypoxia: Secondary | ICD-10-CM

## 2015-02-10 LAB — BASIC METABOLIC PANEL
ANION GAP: 11 (ref 5–15)
BUN: 39 mg/dL — AB (ref 6–20)
CALCIUM: 8.9 mg/dL (ref 8.9–10.3)
CO2: 27 mmol/L (ref 22–32)
CREATININE: 1.42 mg/dL — AB (ref 0.61–1.24)
Chloride: 98 mmol/L — ABNORMAL LOW (ref 101–111)
GFR calc Af Amer: 49 mL/min — ABNORMAL LOW (ref >60)
GFR calc non Af Amer: 42 mL/min — ABNORMAL LOW (ref >60)
Glucose, Bld: 163 mg/dL — ABNORMAL HIGH (ref 65–99)
Potassium: 3.7 mmol/L (ref 3.5–5.1)
Sodium: 136 mmol/L (ref 135–145)

## 2015-02-10 LAB — CBC
HEMATOCRIT: 43.3 % (ref 40.0–52.0)
HEMOGLOBIN: 14.3 g/dL (ref 13.0–18.0)
MCH: 29.6 pg (ref 26.0–34.0)
MCHC: 33.1 g/dL (ref 32.0–36.0)
MCV: 89.5 fL (ref 80.0–100.0)
PLATELETS: 306 10*3/uL (ref 150–440)
RBC: 4.83 MIL/uL (ref 4.40–5.90)
RDW: 14.5 % (ref 11.5–14.5)
WBC: 16.2 10*3/uL — AB (ref 3.8–10.6)

## 2015-02-10 LAB — SURGICAL PATHOLOGY

## 2015-02-10 LAB — GLUCOSE, CAPILLARY: Glucose-Capillary: 207 mg/dL — ABNORMAL HIGH (ref 65–99)

## 2015-02-10 MED ORDER — METHYLPREDNISOLONE SODIUM SUCC 40 MG IJ SOLR
40.0000 mg | Freq: Every day | INTRAMUSCULAR | Status: DC
  Administered 2015-02-11 – 2015-02-12 (×2): 40 mg via INTRAVENOUS
  Filled 2015-02-10 (×2): qty 1

## 2015-02-10 MED ORDER — BENAZEPRIL HCL 10 MG PO TABS: 5.0000 mg | ORAL_TABLET | Freq: Every day | ORAL | Status: DC

## 2015-02-10 MED ORDER — FLUCONAZOLE 100 MG PO TABS
100.0000 mg | ORAL_TABLET | Freq: Every day | ORAL | Status: DC
  Administered 2015-02-10 – 2015-02-12 (×3): 100 mg via ORAL
  Filled 2015-02-10 (×3): qty 1

## 2015-02-10 MED ORDER — PANTOPRAZOLE SODIUM 40 MG PO TBEC
40.0000 mg | DELAYED_RELEASE_TABLET | Freq: Every day | ORAL | Status: DC
  Administered 2015-02-10 – 2015-02-12 (×3): 40 mg via ORAL
  Filled 2015-02-10 (×3): qty 1

## 2015-02-10 MED ORDER — MORPHINE SULFATE 2 MG/ML IJ SOLN: 2.0000 mg | INTRAMUSCULAR | Status: DC | PRN

## 2015-02-10 MED ORDER — ENSURE ENLIVE PO LIQD
237.0000 mL | Freq: Three times a day (TID) | ORAL | Status: DC
  Administered 2015-02-10 – 2015-02-12 (×7): 237 mL via ORAL

## 2015-02-10 NOTE — Progress Notes (Signed)
Patient confused at times. Per Dr Mortimer Fries titrate hf to maintain oxygen 88% or above. PT worked with patient, tolerating diet. Held BP meds per Dr Mortimer Fries. Patient made DNR based on pallative care discussion with patient.

## 2015-02-10 NOTE — Progress Notes (Signed)
Nutrition Follow-up    INTERVENTION:  Meals and Snacks: Cater to patient preferences Medical Food Supplement Therapy: increase EnsureEnlive frequency to TID  NUTRITION DIAGNOSIS:   Inadequate oral intake related to inability to eat as evidenced by NPO status. Improving as diet advanced, pt eating >50% of meals on average, taking supplement   GOAL:   Patient will meet greater than or equal to 90% of their needs   MONITOR:    (Energy Intake, digestive System, Anthropometrics)  REASON FOR ASSESSMENT:   Malnutrition Screening Tool    ASSESSMENT:     Pt remains on HFNC; pt seen by SLP, diet downgraded to Dysphagia III with Thin Liquids; palliative care following  Diet Order:  DIET DYS 3 Room service appropriate?: Yes; Fluid consistency:: Thin   Energy Intake: pt ate 75% of french toast, 2 milk cartons, some yogurt at breakfast this AM; pt asking for ice cream on visit today; pt reports she is drinking some Ensure. Pt reports he has not appetite, reports he eats to "stay alive"  Skin:  Reviewed, no issues  Last BM:   8/1  Electrolyte and Renal Profile:  Recent Labs Lab 02/08/15 0353 02/09/15 0406 02/10/15 0657  BUN 22* 26* 39*  CREATININE 1.34* 1.21 1.42*  NA 131* 133* 136  K 3.9 4.3 3.7   Glucose Profile: No results for input(s): GLUCAP in the last 72 hours. Protein Profile:  Recent Labs Lab 02/05/15 1934  ALBUMIN 3.9   Meds: senokot, solumedrol, milk of mag, lactulose, lasix, dulcolax suppository  Height:   Ht Readings from Last 1 Encounters:  02/05/15 5\' 10"  (1.778 m)    Weight:   Wt Readings from Last 1 Encounters:  02/05/15 157 lb 11.2 oz (71.532 kg)    BMI:  Body mass index is 22.63 kg/(m^2).  Estimated Nutritional Needs:   Kcal:  BEE: 1352kcals, TEE: (IF 1.1-1.3)(AF 1.2) 1816-2147kcals  Protein:  72-86g protein (1.0-1.2g/kg)  Fluid:  1788-2142mL of fluid (25-18mL/kg)  EDUCATION NEEDS:   No education needs identified at this  time  [place], RD, LDN [phone] Pager

## 2015-02-10 NOTE — Progress Notes (Signed)
Physical Therapy Treatment Patient Details Name: Richard Marsh MRN: [id number] DOB: Mar 03, 1923 Today's Date: 02/10/2015    History of Present Illness Richard Marsh is a 81 y.o. male with a known history of chronic constipation, GERD with esophagitis, essential hypertension presenting with back pain. Denies any recent trauma or falls approximately 1 week prior had an episode where he apparently "slid off the couch" no pain at that time. Now describes back pain, lower back over the spine approximately 4 days worse with movement, nonradiating, "pain" quality, 8/10 intensity. Imaging  Imaging revealed T12 compression fx.    PT Comments    At the beginning of the session pt reports a strong urgency for excretion, so PT was able to transfer him to the bedside commode using a stand pivot transfer with RW (min assist). Pt has decreased use of O2 to 45 L/min today, but O2 sats dropped to low 80s (79% at the lowest) with active movements/transfers (such as the [facility] transfer. PT was instructed to maintain deep breathing throughout the therapy session and was able to eventually return to baseline (90%) in ~2-3 min after each movement. Pt able to maintain standing posture for at least 1 min with RW (min assist) but is limitied by dropping O2 sats.  PT provided transfer education to and from BSC/recliner chair with verbal cuing to push off of the bed and to slowly trunk flex when descending, pt was easily corrected with verbal cuing. Pt finished session in recliner chair and reported that it "felt good to sit up." Pt would benefit from skilled PT in order to increase cardiorespiratory endurance, increase gross strength, and improve with sit to stand transfers.    Follow Up Recommendations  SNF     Equipment Recommendations  Rolling walker with 5" wheels    Recommendations for Other Services       Precautions / Restrictions Precautions Precautions: Fall;Back Restrictions Weight Bearing Restrictions: No     Mobility  Bed Mobility Overal bed mobility: Needs Assistance Bed Mobility: Supine to Sit     Supine to sit: Min assist     General bed mobility comments: O2 sats dropping with active movement, but was able to tolerate.   Transfers Overall transfer level: Needs assistance Equipment used: Rolling walker (2 wheeled) Transfers: Sit to/from Stand Sit to Stand: Min assist         General transfer comment: Pt was able to stand with RW (min assist) but had trouble maintaining standing position secondary to dropping O2 sats.  Pt was able to transfer to [facility] and was instructed to perform deep breathing, O2 sats eventually returned to baseline.   Ambulation/Gait                 Stairs            Wheelchair Mobility    Modified Rankin (Stroke Patients Only)       Balance Overall balance assessment: Needs assistance Sitting-balance support: Single extremity supported;Bilateral upper extremity supported;Feet supported Sitting balance-Leahy Scale: Good Sitting balance - Comments: Pt able to maintain posture but CGA was provided for safety purposes secondary to hx of falls.    Standing balance support: Bilateral upper extremity supported Standing balance-Leahy Scale: Fair Standing balance comment: Pt able to maintain posture for at least 1 min with RW (min assist) but is limitied by dropping O2 sats.                     Cognition Arousal/Alertness: Awake/alert Behavior  During Therapy: WFL for tasks assessed/performed Overall Cognitive Status: Within Functional Limits for tasks assessed                      Exercises Other Exercises Other Exercises: Transfer education to [facility] and to recliner chair, verbal cuing to push off of the bed and to slowly trunk flex when descending, pt was easily corrected with verbal cuing, RW/min assist.     General Comments        Pertinent Vitals/Pain      Home Living Family/patient expects to be discharged to::  Skilled nursing facility                    Prior Function            PT Goals (current goals can now be found in the care plan section) Acute Rehab PT Goals Patient Stated Goal: to walk PT Goal Formulation: With patient/family Time For Goal Achievement: 02/21/15 Potential to Achieve Goals: Fair Progress towards PT goals: Not progressing toward goals - O2 sats reain at low 90s and require 45 L/min, needs to improve cardiorespiratory endurance.     Frequency  Min 2X/week    PT Plan Current plan remains appropriate    Co-evaluation             End of Session Equipment Utilized During Treatment: Gait belt;Oxygen Activity Tolerance: Patient limited by fatigue Patient left: with call bell/phone within reach;with family/visitor present;in chair     Time: 1040-1109 PT Time Calculation (min) (ACUTE ONLY): 29 min  Charges:                       G CodesBernestine Amass, SPT 02/16/15 12:17 PM

## 2015-02-10 NOTE — Consult Note (Signed)
Reason for Consult: atrial fibrillation, respiratory failure Referring Physician:  Dr. Volanda Napoleon hospitalist  Richard Marsh is an 81 y.o. male.  HPI:  81 y/o white male multiple medical problems present with acute  respiratory failure. Patient presents with shortness of breath congestion possible aspiration pneumonia he has a history of pulmonary fibrosis and presented with respiratory failure.  Patient denies fever chills or sweats minimal sputum production but a week cough .  The patient developed rapid atrial fibrillation and which required transferred to the intensive care unit at  treatment with intravenous Cardizem.  Patient denies any chest pain no blackout spells of syncope but complains of weakness severe shortness of breath. Cardiology consultation as recommended because of atrial fibrillation.  Past Medical History  Diagnosis Date  . Cancer   . Renal disorder     Past Surgical History  Procedure Laterality Date  . Kyphoplasty N/A 02/06/2015    Procedure: KYPHOPLASTY T12;  Surgeon: Hessie Knows, MD;  Location: [facility] ORS;  Service: Orthopedics;  Laterality: N/A;    Family History  Problem Relation Age of Onset  . Heart failure Neg Hx     Social History:  reports that he has quit smoking. He does not have any smokeless tobacco history on file. He reports that he does not drink alcohol. His drug history is not on file.  Allergies:  Allergies  Allergen Reactions  . Demerol [Meperidine] Anaphylaxis  . Statins Other (See Comments)    Reaction:  Unknown     Medications: I have reviewed the patient's current medications.  Results for orders placed or performed during the hospital encounter of 02/05/15 (from the past 48 hour(s))  CBC     Status: Abnormal   Collection Time: 02/09/15  4:06 AM  Result Value Ref Range   WBC 11.0 (H) 3.8 - 10.6 K/uL   RBC 3.96 (L) 4.40 - 5.90 MIL/uL   Hemoglobin 11.9 (L) 13.0 - 18.0 g/dL   HCT 35.3 (L) 40.0 - 52.0 %   MCV 89.1 80.0 - 100.0 fL   MCH 30.0 26.0 - 34.0 pg   MCHC 33.6 32.0 - 36.0 g/dL   RDW 14.3 11.5 - 14.5 %   Platelets 185 150 - 440 K/uL  Basic metabolic panel     Status: Abnormal   Collection Time: 02/09/15  4:06 AM  Result Value Ref Range   Sodium 133 (L) 135 - 145 mmol/L   Potassium 4.3 3.5 - 5.1 mmol/L   Chloride 102 101 - 111 mmol/L   CO2 24 22 - 32 mmol/L   Glucose, Bld 164 (H) 65 - 99 mg/dL   BUN 26 (H) 6 - 20 mg/dL   Creatinine, Ser 1.21 0.61 - 1.24 mg/dL   Calcium 8.4 (L) 8.9 - 10.3 mg/dL   GFR calc non Af Amer 51 (L) >60 mL/min   GFR calc Af Amer 59 (L) >60 mL/min    Comment: (NOTE) The eGFR has been calculated using the CKD EPI equation. This calculation has not been validated in all clinical situations. eGFR's persistently <60 mL/min signify possible Chronic Kidney Disease.    Anion gap 7 5 - 15  Culture, expectorated sputum-assessment     Status: None   Collection Time: 02/09/15  9:15 AM  Result Value Ref Range   Specimen Description SPUTUM    Special Requests Normal    Sputum evaluation THIS SPECIMEN IS ACCEPTABLE FOR SPUTUM CULTURE    Report Status 02/09/2015 FINAL   Culture, respiratory (NON-Expectorated)  Status: None (Preliminary result)   Collection Time: 02/09/15  9:15 AM  Result Value Ref Range   Specimen Description SPUTUM    Special Requests Normal Reflexed from [id number]    Gram Stain PENDING    Culture      HEAVY GROWTH YEAST IDENTIFICATION TO FOLLOW ONCE BETTER GROWTH    Report Status PENDING   Basic metabolic panel     Status: Abnormal   Collection Time: 02/10/15  6:57 AM  Result Value Ref Range   Sodium 136 135 - 145 mmol/L   Potassium 3.7 3.5 - 5.1 mmol/L   Chloride 98 (L) 101 - 111 mmol/L   CO2 27 22 - 32 mmol/L   Glucose, Bld 163 (H) 65 - 99 mg/dL   BUN 39 (H) 6 - 20 mg/dL   Creatinine, Ser 1.42 (H) 0.61 - 1.24 mg/dL   Calcium 8.9 8.9 - 10.3 mg/dL   GFR calc non Af Amer 42 (L) >60 mL/min   GFR calc Af Amer 49 (L) >60 mL/min    Comment: (NOTE) The eGFR has  been calculated using the CKD EPI equation. This calculation has not been validated in all clinical situations. eGFR's persistently <60 mL/min signify possible Chronic Kidney Disease.    Anion gap 11 5 - 15  CBC     Status: Abnormal   Collection Time: 02/10/15  6:57 AM  Result Value Ref Range   WBC 16.2 (H) 3.8 - 10.6 K/uL   RBC 4.83 4.40 - 5.90 MIL/uL   Hemoglobin 14.3 13.0 - 18.0 g/dL   HCT 43.3 40.0 - 52.0 %   MCV 89.5 80.0 - 100.0 fL   MCH 29.6 26.0 - 34.0 pg   MCHC 33.1 32.0 - 36.0 g/dL   RDW 14.5 11.5 - 14.5 %   Platelets 306 150 - 440 K/uL  Glucose, capillary     Status: Abnormal   Collection Time: 02/10/15 11:23 AM  Result Value Ref Range   Glucose-Capillary 207 (H) 65 - 99 mg/dL    Dg Chest 1 View  02/09/2015   CLINICAL DATA:  Acute respiratory failure, aspiration pneumonia, history of pulmonary fibrosis and previous tobacco use  EXAM: CHEST  1 VIEW  COMPARISON:  Portable chest x-ray of February 08, 2015  FINDINGS: The lungs are slightly better inflated today. There persistent diffusely increased interstitial densities. There is no pleural effusion. The cardiac silhouette is enlarged but stable. The pulmonary vascularity is indistinct. There is no pleural effusion or pneumothorax. The observed bony thorax exhibits no acute abnormality.  IMPRESSION: Slight interval improvement in the aeration of the lungs with slight decreased prominence of interstitial densities. This may reflect improving interstitial pneumonia superimposed upon chronic pulmonary fibrosis.   Electronically Signed   By: David  Martinique M.D.   On: 02/09/2015 07:30   Dg Chest Port 1 View  02/10/2015   CLINICAL DATA:  Respiratory failure.  EXAM: PORTABLE CHEST - 1 VIEW  COMPARISON:  02/09/2015 .  02/07/2015.  07/31/2012.  FINDINGS: Mediastinum and hilar structures are unremarkable. Stable cardiomegaly. Persistent interstitial prominence noted throughout both lung fields consistent with active interstitial process such as  pneumonitis and interstitial edema. These changes are superimposed on chronic interstitial lung disease. No pleural effusion or pneumothorax.  IMPRESSION: 1. Persistent unchanged interstitial prominence consistent with active interstitial process such as pneumonitis or interstitial pulmonary edema. These changes are superimposed on chronic interstitial lung disease.  2.  Stable cardiomegaly.   Electronically Signed   By: Marcello Moores  Register  On: 02/10/2015 07:29    Review of Systems  Constitutional: Positive for malaise/fatigue.  HENT: Positive for congestion.   Eyes: Negative.   Respiratory: Positive for cough, shortness of breath and wheezing.   Cardiovascular: Positive for palpitations, orthopnea and PND.  Gastrointestinal: Negative.   Genitourinary: Negative.   Musculoskeletal: Negative.   Skin: Negative.   Neurological: Positive for weakness.  Endo/Heme/Allergies: Negative.   Psychiatric/Behavioral: Negative.    Blood pressure 126/62, pulse 95, temperature 97.9 F (36.6 C), temperature source Oral, resp. rate 18, height 5' 10"  (1.778 m), weight 71.532 kg (157 lb 11.2 oz), SpO2 95 %. Physical Exam  Vitals reviewed. Constitutional: He is oriented to person, place, and time. He appears well-developed and well-nourished.  HENT:  Head: Normocephalic and atraumatic.  Right Ear: External ear normal.  Eyes: Conjunctivae and EOM are normal. Pupils are equal, round, and reactive to light.  Neck: Normal range of motion. Neck supple.  Cardiovascular: S1 normal, S2 normal and intact distal pulses.  An irregularly irregular rhythm present. Tachycardia present.  PMI is displaced.   Murmur heard.  Systolic murmur is present with a grade of 2/6  Respiratory: Accessory muscle usage present. Tachypnea noted. He has decreased breath sounds in the right upper field, the right middle field, the right lower field, the left upper field, the left middle field and the left lower field. He has rhonchi in the  right upper field, the right middle field, the right lower field, the left upper field, the left middle field and the left lower field.  GI: Soft. Bowel sounds are normal.  Musculoskeletal: Normal range of motion.  Neurological: He is alert and oriented to person, place, and time.  Skin: Skin is dry.  Psychiatric: He has a normal mood and affect. His behavior is normal.    Assessment/Plan:  atrial fibrillation and rapid ventricular response   respiratory  Failure  shortness of breath  pulmonary fibrosis  reflux   Hyponatremia  aspiration pneumonia  anxiety . PLAN  continue ICU care  agree with inhalers for shortness of breath  continue intravenous antibiotics for possible aspiration pneumonia  continue treatment for reflux symptoms including Protonix  continue DVT prophylaxis  agree with IV diltiazem therapy  agree with Pulmonary Intensive Care input  consider steroid therapy  continue medical therapy  CALLWOOD,DWAYNE D. 02/10/2015, 1:14 PM

## 2015-02-10 NOTE — Progress Notes (Signed)
[place] [facility] [place] NAME: Richard Marsh    MR#:  [id number]  DATE OF BIRTH:  05-19-1923  SUBJECTIVE:  CHIEF COMPLAINT:   Chief Complaint  Patient presents with  . Fecal Impaction   Continues to require high flow oxygen. Alert oriented and conversant.  REVIEW OF SYSTEMS:   Review of Systems  Constitutional: Positive for malaise/fatigue. Negative for fever.  Respiratory: Positive for cough, sputum production, shortness of breath and wheezing.   Cardiovascular: Negative for chest pain and palpitations.  Gastrointestinal: Negative for nausea, vomiting and abdominal pain.  Genitourinary: Negative for dysuria.  Neurological: Positive for weakness.    DRUG ALLERGIES:   Allergies  Allergen Reactions  . Demerol [Meperidine] Anaphylaxis  . Statins Other (See Comments)    Reaction:  Unknown     VITALS:  Blood pressure 126/62, pulse 95, temperature 97.9 F (36.6 C), temperature source Oral, resp. rate 18, height 5\' 10"  (1.778 m), weight 71.532 kg (157 lb 11.2 oz), SpO2 95 %.  PHYSICAL EXAMINATION:  GENERAL:  81 y.o.-year-old patient lying in the bed thin, short of breath, High flow nasal cannula LUNGS: Short shallow respirations, diffuse wheezing and rhonchi, poor air movement CARDIOVASCULAR: S1, S2 normal. No murmurs, rubs, or gallops. Tachycardic irregular ABDOMEN: Soft, nontender, nondistended. Bowel sounds present. No organomegaly or mass.  EXTREMITIES: No pedal edema, cyanosis, or clubbing.  NEUROLOGIC: Cranial nerves II through XII are grossly intact.   PSYCHIATRIC: The patient is alert and oriented x 3.  SKIN: No obvious rash, lesion, or ulcer.   LABORATORY PANEL:   CBC  Recent Labs Lab 02/10/15 0657  WBC 16.2*  HGB 14.3  HCT 43.3  PLT 306   ------------------------------------------------------------------------------------------------------------------  Chemistries   Recent Labs Lab 02/05/15 1934   02/10/15 0657  NA 129*  < > 136  K 4.6  < > 3.7  CL 96*  < > 98*  CO2 23  < > 27  GLUCOSE 104*  < > 163*  BUN 20  < > 39*  CREATININE 1.16  < > 1.42*  CALCIUM 8.9  < > 8.9  AST 24  --   --   ALT 9*  --   --   ALKPHOS 69  --   --   BILITOT 1.0  --   --   < > = values in this interval not displayed. ------------------------------------------------------------------------------------------------------------------  Cardiac Enzymes  Recent Labs Lab 02/05/15 1934  TROPONINI <0.03   ------------------------------------------------------------------------------------------------------------------  RADIOLOGY:  Dg Chest 1 View  02/09/2015   CLINICAL DATA:  Acute respiratory failure, aspiration pneumonia, history of pulmonary fibrosis and previous tobacco use  EXAM: CHEST  1 VIEW  COMPARISON:  Portable chest x-ray of February 08, 2015  FINDINGS: The lungs are slightly better inflated today. There persistent diffusely increased interstitial densities. There is no pleural effusion. The cardiac silhouette is enlarged but stable. The pulmonary vascularity is indistinct. There is no pleural effusion or pneumothorax. The observed bony thorax exhibits no acute abnormality.  IMPRESSION: Slight interval improvement in the aeration of the lungs with slight decreased prominence of interstitial densities. This may reflect improving interstitial pneumonia superimposed upon chronic pulmonary fibrosis.   Electronically Signed   By: David  Martinique M.D.   On: 02/09/2015 07:30   Dg Chest Port 1 View  02/10/2015   CLINICAL DATA:  Respiratory failure.  EXAM: PORTABLE CHEST - 1 VIEW  COMPARISON:  02/09/2015 .  02/07/2015.  07/31/2012.  FINDINGS: Mediastinum and hilar  structures are unremarkable. Stable cardiomegaly. Persistent interstitial prominence noted throughout both lung fields consistent with active interstitial process such as pneumonitis and interstitial edema. These changes are superimposed on chronic interstitial  lung disease. No pleural effusion or pneumothorax.  IMPRESSION: 1. Persistent unchanged interstitial prominence consistent with active interstitial process such as pneumonitis or interstitial pulmonary edema. These changes are superimposed on chronic interstitial lung disease.  2.  Stable cardiomegaly.   Electronically Signed   By: Marcello Moores  Register   On: 02/10/2015 07:29    EKG:   Orders placed or performed in visit on 05/25/13  . EKG 12-Lead    ASSESSMENT AND PLAN:    1) acute respiratory failure with hypoxia due to pulmonary fibrosis, healthcare associated pneumonia, concern for aspiration pneumonia - Pulmonology consultation appreciated - Continue with high flow oxygen via nasal cannula, taper as tolerated - Solu-Medrol, nebulizers, Zosyn - Speech therapy has seen and does not feel that he needs diet modification, he does need aspiration precautions however needs to sit up and/drink carefully. - Agree with addition of when necessary morphine  2) healthcare associated pneumonia - Blood cultures negative to date, sputum culture with heavy growth of yeast-will add Diflucan - Chest x-ray improved  3) UTI: Urine culture negative. Rocephin day 4, now transitioned to Zosyn as above  4) hyponatremia: Serum sodium is normal today  5) T12 compression fracture:  - s/p kyphoplasty. Pain greatly reduced.  6) delirium: Resolved.  7) atrial fibrillation with rapid ventricular rate - Has transitioned from Cardizem drip to oral Cardizem. Heart rate currently controlled - Appreciate cardiology consultation - Echo shows normal LV function, mild pulmonary hypertension  #8 hypertension: - Appreciate palliative care changing blood pressure medications due to hypotension now that Cardizem has been added.  Okay to transfer to telemetry  CODE STATUS: DO NOT RESUSCITATE, appreciate palliative care consultation   TOTAL CRITICAL CARE TIME TAKING CARE OF THIS PATIENT: 35 minutes.   POSSIBLE D/C  IN 3-4 DAYS, DEPENDING ON CLINICAL CONDITION.   Myrtis Ser M.D on 02/10/2015 at 2:44 PM  Between 7am to 6pm - Pager - [phone]  After 6pm go to [email or web address] - password [email or web address] [facility]  [facility]  Office  [phone]  CC: Primary care physician; No primary care provider on file.

## 2015-02-10 NOTE — Progress Notes (Signed)
Speech Language Pathology Treatment: Dysphagia  Patient Details Name: Richard Marsh MRN: [id number] DOB: [birth year] Today's Date: 02/10/2015 Time: 0900-0940 SLP Time Calculation (min) (ACUTE ONLY): 40 min  Assessment / Plan / Recommendation Clinical Impression  Pt appears to be tolerating his current mech soft diet w/ thin liquids w/ no overt s/s of aspiration noted during tx session as he fed himself his breakfast meal; pt consumed ~75% of meal w/ 2 cartons of milk via straw. No decline in respiratory status noted; O2 sats remained b/t 93-95%; RR in the 20's. Instructed pt to use rest breaks to avoid increased WOB/SOB and to clear mouth well b/t bites. Pt was able to follow instruction w/ intermittent verbal cues. Pt appears at reduced risk for aspiration following general aspiration precautions; Meds in Puree (whole). ST will f/u next 1-3 days.    HPI Other Pertinent Information: Pt is a 81 y.o. male with a known history of chronic constipation, pulmonary fibrosis, GERD with esophagitis, Barrett's Esophagus, essential hypertension presenting with back pain. Denies any recent trauma or falls approximately 1 week prior had an episode where he apparently "slid off the couch" no pain at that time. Now describes back pain, lower back over the spine approximately 4 days worse with movement, nonradiating, "pain" quality, 8/10 intensity. Son reported pt's oral intake heavily consists of drinking Ensures at home(pt does have Reflux per his report; Barrett's Esophagus). Pt had kyphoplasty done post admission followed by declined respiratory status w/ distress and increased support needs; afib w/ rapid ventricular rate being tx'd by MD. Pt appears weak and easily SOB w/ exertion. He remains on HFNC for O2 support. He has been tolerating his current mech soft diet w/ thin liquids w/out s/s of aspiration noted per NSG. NSG during the night reported difficulty swallowing pills but unsure of details of this.     Pertinent Vitals Pain Assessment: No/denies pain  SLP Plan  Continue with current plan of care    Recommendations Diet recommendations: Dysphagia 3 (mechanical soft);Thin liquid Liquids provided via: Cup;Straw Medication Administration: Whole meds with puree Supervision: Patient able to self feed;Intermittent supervision to cue for compensatory strategies Compensations: Slow rate;Small sips/bites Postural Changes and/or Swallow Maneuvers: Seated upright 90 degrees (rest breaks as nec. to avoid WOB)              General recommendations:  (Dietician consult/following) Oral Care Recommendations: Oral care BID;Oral care before and after PO;Staff/trained caregiver to provide oral care Follow up Recommendations: Skilled Nursing facility Plan: Continue with current plan of care    [place], [place], CCC-SLP  Richard Marsh 02/10/2015, 2:26 PM

## 2015-02-10 NOTE — Progress Notes (Signed)
Pt is alert and oriented. Getting o2 via HF. [place]. Sao2  90-92%. Lung sound rochi. A fib on Cm. UOP good. No c/o pain or SOb durning night. Slept on and off. Resting in bed  Comfortably without any distress. Observed closely.

## 2015-02-10 NOTE — Consult Note (Signed)
Palliative Medicine Inpatient Consult Follow Up Note   Name: Richard Marsh Date: 02/10/2015 MRN: [id number]  DOB: Apr 16, 1923  Referring Physician: [name], MD  Palliative Care consult requested for this 81 y.o. male for goals of medical therapy in patient with acute respiratory failure with hypoxia (on high flow oxygen currently but not on oxygen at home), pulmonary fibrosis, pulmonary edema, HCAP vs aspiration pneumonia, hyponatremia, delerium and intermittent confusion. He was admitted with  lower back pain which developed after he slid off the couch a week prior. A Ct scan confirmed a subacute T12 compression fracture and he underwent kyphoplasty.  His oxygen sats dropped after this procedure. Marland Kitchen He was thought to have a UTI at time of admission and was started on ABX which have been adjusted.But UA cannot be found as having been done at time of adm.  And a urine culture sent 02/08/15 is negative for UTI (aftger several days on ABX). Blood cxs are negative.  He has been on high flow oxygen at variable volumes (60% to 70%).  He was diuresed and an echo has resulted showing mild diastolic dysfunction with mild pulmonary HTN and an EF of 65 - 70%.  His rhythm changed to Afib with rapid response and he was placed on  a Cardizem drip and then transitioned to oral Cardizem.  He at first had requested a Limited Code status but this has now been changed.    One of his adult children has reported that he has'driven most family members away'.  He has made it hard for them to do anything for him or to be involved.  He does have a POA document but no HCPOA or Living Will.  He is talking quite a lot about never getting better and desiring to be comfortable since he does not feel he will be cured.   TODAY'S CONVERSATIONS AND ORDERS, RECOMMENDATIONS, AND PLAN:  1)  He is close to wanting only comfort care, but he is also afraid of dying.  He would like me to try to get him off of hi-flow oxygen and  'all these wires'.  His wife was under the care of Hospice and I mentioned this to him today.  It takes a series of discussions to get anywhere with him in terms of his wishes, mostly because he is very ambivalent about choosing either comfort care or aggressive care.   2) He agrees to have his code status changed to DNR today. 3)  He seems to want a little bit of aggressive care and a little bit of comfort care.  Not too much in one direction or the other. His sats are 95 -97 % so he can certainly come down a bit off the hi flow and I have communicated parameters for sats to be 87 -90% so we can achieve the goal of getting him off 'all these wires and tubes'.   4) Nursing asked me to adjust his HTN meds a bit as he is running low BP --I made very minor changes but defer further adjustments to his attending [facility] Norvasc as he is also on Cardizem and decreased the Lisinopril from 10 mg to 5 mg --at least for now). 5) I would encourage nursing to dose him with prn Morphine --and I have increased the permitted frequency.       REVIEW OF SYSTEMS:  He says he is miserable.  He wants to be off 'all these wires'.  Asks to come off of the tele  and pulse ox etc, but wants to do so without dying right away.  Does not feel like he is going to get much better and just wants to be comfortable. But no comfort care only status just yet.  Unable to get full ROS due to some tangential and interrupting speech.    CODE STATUS:  HE WAS LIMITED CODE UNTIL TODAY. NOW HE IS CHANGED TO DNR PER MY DISCUSSION WITH HIM WITNESSED BY HIS NURSE   PAST MEDICAL HISTORY: Past Medical History  Diagnosis Date  . Cancer   . Renal disorder     PAST SURGICAL HISTORY:  Past Surgical History  Procedure Laterality Date  . Kyphoplasty N/A 02/06/2015    Procedure: KYPHOPLASTY T12;  Surgeon: Hessie Knows, MD;  Location: [facility] ORS;  Service: Orthopedics;  Laterality: N/A;    Vital Signs: BP 127/66 mmHg  Pulse 102  Temp(Src) 97.7  F (36.5 C) (Oral)  Resp 27  Ht 5\' 10"  (1.778 m)  Wt 71.532 kg (157 lb 11.2 oz)  BMI 22.63 kg/m2  SpO2 94% Filed Weights   02/05/15 1742 02/05/15 2304  Weight: 72.576 kg (160 lb) 71.532 kg (157 lb 11.2 oz)    Estimated body mass index is 22.63 kg/(m^2) as calculated from the following:   Height as of this encounter: 5\' 10"  (1.778 m).   Weight as of this encounter: 71.532 kg (157 lb 11.2 oz).  PHYSICAL EXAM: NAD Talking without conversational dyspnea EOMI OP clear Neck NO JVD seen And No TM Heart rrr no mgr Lungs with ronchi bilaterally but no wheezing or rales are hears Abd soft and NT with nl BS Sacrum has a protecting type of dressing to prevent breakdown Ext no cyanosis or clubbing Skin is warm, pale, and dry He is alert and oriented and talking normally with appropriate answers to questions (though he is a bit tangential)  LABS: CBC:    Component Value Date/Time   WBC 16.2* 02/10/2015 0657   WBC 9.9 05/25/2013 0346   HGB 14.3 02/10/2015 0657   HGB 13.6 05/25/2013 0346   HCT 43.3 02/10/2015 0657   HCT 40.5 05/25/2013 0346   PLT 306 02/10/2015 0657   PLT 223 05/25/2013 0346   MCV 89.5 02/10/2015 0657   MCV 89 05/25/2013 0346   NEUTROABS 9.0* 02/05/2015 1934   LYMPHSABS 3.4 02/05/2015 1934   MONOABS 1.3* 02/05/2015 1934   EOSABS 0.3 02/05/2015 1934   BASOSABS 0.1 02/05/2015 1934   Comprehensive Metabolic Panel:    Component Value Date/Time   NA 136 02/10/2015 0657   NA 133* 05/25/2013 0346   K 3.7 02/10/2015 0657   K 4.2 05/25/2013 0346   CL 98* 02/10/2015 0657   CL 103 05/25/2013 0346   CO2 27 02/10/2015 0657   CO2 24 05/25/2013 0346   BUN 39* 02/10/2015 0657   BUN 18 05/25/2013 0346   CREATININE 1.42* 02/10/2015 0657   CREATININE 1.53* 05/25/2013 0346   GLUCOSE 163* 02/10/2015 0657   GLUCOSE 100* 05/25/2013 0346   CALCIUM 8.9 02/10/2015 0657   CALCIUM 8.9 05/25/2013 0346   AST 24 02/05/2015 1934   AST 18 07/31/2012 0341   ALT 9* 02/05/2015  1934   ALT 17 07/31/2012 0341   ALKPHOS 69 02/05/2015 1934   ALKPHOS 77 07/31/2012 0341   BILITOT 1.0 02/05/2015 1934   BILITOT 0.4 07/31/2012 0341   PROT 7.7 02/05/2015 1934   PROT 7.3 07/31/2012 0341   ALBUMIN 3.9 02/05/2015 1934   ALBUMIN 3.6 07/31/2012 0341  ECHO REPORT 02/09/15: - Left ventricle: The cavity size was normal. Systolic function was vigorous. The estimated ejection fraction was in the range of 65% to 70%. Wall motion was normal; there were no regional wall motion abnormalities. Doppler parameters are consistent with abnormal left ventricular relaxation (grade 1 diastolic dysfunction). - Aortic valve: Valve area (Vmax): 2.28 cm^2. - Mitral valve: There was mild regurgitation. - Left atrium: The atrium was mildly dilated. - Atrial septum: No defect or patent foramen ovale was identified. Impressions: - The right ventricular systolic pressure was increased consistent with mild pulmonary hypertension. Normal LVEF but mild diastolic dysfunction and normal wall motion.  IMPRESSION: 1) Acute respiratory failure with hypoxia 2) Pulmonary Fibrosis (Chronic Idiopathic Pulmonary Fibrosis with traction Bronchiectasis) --not previously on home oxygenor CPAP and not treated with meds 3) Possible Aspiration Pneumonia (per Pulmonologist who noted that pts oxygen sats dropped soon after the kyphoplasty he had after admission) VS HCAP 4) Osteoporosis with subacute T12 compression fracture causing the pain which brought him here and resulting in the kyphoplasty which has greatly reduced his lower back pain.  5) Urinary retention --new since admission?  6) Constipation-treated and resolved ( he had fecal impaction at admission apparently) 7) Hyponatremia --stable 8) Metabolic Encephalopathy --resolved 9) Afib with RVR --ehco just done --on Cardizem drip for rate control --apparently new as he has not been followed by a cardiologist nor has he been told he has  'Afib' 10) CAD and stent placement --remotely (not followed by any cardiologists) 10) History of skin cancers (only basal cells per pt) 11) History of Barrett's Esophagus and Esophageal Stricture Dilation (has been seen by Dr. Candace Cruise, Gastroenterologist)  12) Wt loss and decreased appetite since 2007 documented in outpatient notes (as shown to me by ST) 13) History of colon polyps 14) Diastolic Dysfunction 15) Mild pulmonary HTN  See Above for PLAN:   REFERRALS TO BE ORDERED:  None as yet.    More than 50% of the visit was spent in counseling/coordination of care: YES  Time Spent: 40 min

## 2015-02-10 NOTE — Consult Note (Signed)
Date: 02/10/2015,   MRN# [id number] Richard Marsh Apr 11, 1923 Code Status:     Code Status Orders        Start     Ordered   02/10/15 1003  Do not attempt resuscitation (DNR)   Continuous    Question Answer Comment  In the event of cardiac or respiratory ARREST Do not call a "code blue"   In the event of cardiac or respiratory ARREST Do not perform Intubation, CPR, defibrillation or ACLS   In the event of cardiac or respiratory ARREST Use medication by any route, position, wound care, and other measures to relive pain and suffering. May use oxygen, suction and manual treatment of airway obstruction as needed for comfort.      02/10/15 1002         CHIEF COMPLAINT:   Follow up resp failure/SOb   Remains SOB, high flow Gays at 70% fio2, remains with generalized weakness   PAST MEDICAL HISTORY   Past Medical History  Diagnosis Date  . Cancer   . Renal disorder      SURGICAL HISTORY   Past Surgical History  Procedure Laterality Date  . Kyphoplasty N/A 02/06/2015    Procedure: KYPHOPLASTY T12;  Surgeon: Hessie Knows, MD;  Location: [facility] ORS;  Service: Orthopedics;  Laterality: N/A;     FAMILY HISTORY   Family History  Problem Relation Age of Onset  . Heart failure Neg Hx      SOCIAL HISTORY   History  Substance Use Topics  . Smoking status: Former Research scientist (life sciences)  . Smokeless tobacco: Not on file  . Alcohol Use: No     MEDICATIONS    Home Medication:  No current outpatient prescriptions on file.  Current Medication:  Current facility-administered medications:  .  acetaminophen (TYLENOL) tablet 650 mg, 650 mg, Oral, Q6H PRN **OR** acetaminophen (TYLENOL) suppository 650 mg, 650 mg, Rectal, Q6H PRN, Lytle Butte, MD .  antiseptic oral rinse (CPC / CETYLPYRIDINIUM CHLORIDE 0.05%) solution 7 mL, 7 mL, Mouth Rinse, q12n4p, Lytle Butte, MD, 7 mL at 02/09/15 1659 .  aspirin EC tablet 81 mg, 81 mg, Oral, Daily, Lytle Butte, MD, 81 mg at 02/09/15 1458 .   [DISCONTINUED] amLODipine (NORVASC) tablet 5 mg, 5 mg, Oral, Daily, 5 mg at 02/09/15 1457 **AND** benazepril (LOTENSIN) tablet 5 mg, 5 mg, Oral, Daily, Colleen Can, MD .  bisacodyl (DULCOLAX) suppository 10 mg, 10 mg, Rectal, Daily PRN, Hessie Knows, MD .  chlorhexidine (PERIDEX) 0.12 % solution 15 mL, 15 mL, Mouth Rinse, BID, Lytle Butte, MD, 15 mL at 02/09/15 2000 .  diltiazem (CARDIZEM) tablet 60 mg, 60 mg, Oral, 4 times per day, [name], MD, 60 mg at 02/10/15 0534 .  doxepin (SINEQUAN) capsule 25 mg, 25 mg, Oral, QHS, Lytle Butte, MD, 25 mg at 02/09/15 2113 .  enoxaparin (LOVENOX) injection 40 mg, 40 mg, Subcutaneous, Q24H, [name], MD, 40 mg at 02/10/15 0819 .  feeding supplement (ENSURE ENLIVE) (ENSURE ENLIVE) liquid 237 mL, 237 mL, Oral, TID WC, [name], MD .  ipratropium-albuterol (DUONEB) 0.5-2.5 (3) MG/3ML nebulizer solution 3 mL, 3 mL, Nebulization, Q6H, Wilhelmina Mcardle, MD, 3 mL at 02/10/15 0809 .  lactulose (CHRONULAC) 10 GM/15ML solution 20 g, 20 g, Oral, BID PRN, Dereck Leep, MD, 20 g at 02/07/15 0919 .  Linaclotide (LINZESS) capsule 145 mcg, 145 mcg, Oral, Daily, Lytle Butte, MD, 145 mcg at 02/09/15 1457 .  LORazepam (  ATIVAN) tablet 1 mg, 1 mg, Oral, TID, Lytle Butte, MD, 1 mg at 02/09/15 2114 .  LORazepam (ATIVAN) tablet 1 mg, 1 mg, Oral, TID PRN, [name], MD .  magnesium hydroxide (MILK OF MAGNESIA) suspension 30 mL, 30 mL, Oral, Daily PRN, Lytle Butte, MD, 30 mL at 02/06/15 0506 .  methylPREDNISolone sodium succinate (SOLU-MEDROL) 40 mg/mL injection 40 mg, 40 mg, Intravenous, Q12H, Wilhelmina Mcardle, MD, 40 mg at 02/10/15 0819 .  morphine 2 MG/ML injection 2 mg, 2 mg, Intravenous, Q2H PRN, Colleen Can, MD .  ondansetron [facility]) tablet 4 mg, 4 mg, Oral, Q6H PRN **OR** ondansetron (ZOFRAN) injection 4 mg, 4 mg, Intravenous, Q6H PRN, Lytle Butte, MD, 4 mg at 02/06/15 2006 .  oxyCODONE (Oxy IR/ROXICODONE) immediate  release tablet 5 mg, 5 mg, Oral, Q4H PRN, Lytle Butte, MD, 5 mg at 02/08/15 0804 .  pantoprazole (PROTONIX) injection 40 mg, 40 mg, Intravenous, Q24H, Lytle Butte, MD, 40 mg at 02/09/15 2114 .  piperacillin-tazobactam (ZOSYN) IVPB 3.375 g, 3.375 g, Intravenous, 3 times per day, Laverle Hobby, MD, 3.375 g at 02/10/15 0534 .  senna-docusate (Senokot-S) tablet 1 tablet, 1 tablet, Oral, BID, Dereck Leep, MD, 1 tablet at 02/09/15 2114    ALLERGIES   Demerol and Statins     REVIEW OF SYSTEMS   Review of Systems  Constitutional: Positive for malaise/fatigue. Negative for fever, chills, weight loss and diaphoresis.  HENT: Negative for hearing loss.   Eyes: Negative for blurred vision and double vision.  Respiratory: Positive for shortness of breath. Negative for cough and hemoptysis.   Cardiovascular: Negative for chest pain and palpitations.  Gastrointestinal: Negative for heartburn and nausea.  Genitourinary: Negative for flank pain.  Musculoskeletal: Negative for back pain, joint pain and neck pain.  Skin: Negative for itching and rash.  Neurological: Positive for weakness. Negative for dizziness, tingling, tremors, focal weakness and headaches.  Endo/Heme/Allergies: Does not bruise/bleed easily.  Psychiatric/Behavioral: The patient is not nervous/anxious.      VS: BP 127/66 mmHg  Pulse 102  Temp(Src) 97.7 F (36.5 C) (Oral)  Resp 27  Ht 5\' 10"  (1.778 m)  Wt 157 lb 11.2 oz (71.532 kg)  BMI 22.63 kg/m2  SpO2 94%     PHYSICAL EXAM  Physical Exam  Constitutional: He is oriented to person, place, and time. He appears distressed.  Thin and cachectic  HENT:  Head: Normocephalic and atraumatic.  Mouth/Throat: Oropharynx is clear and moist.  Eyes: Conjunctivae and EOM are normal. Pupils are equal, round, and reactive to light. No scleral icterus.  Neck: Normal range of motion. Neck supple.  Cardiovascular: Normal rate, regular rhythm and normal heart sounds.    No murmur heard. Pulmonary/Chest: He is in respiratory distress. He has rales.  Abdominal: Bowel sounds are normal. He exhibits no distension. There is no tenderness.  Musculoskeletal: He exhibits no edema.  Neurological: He is alert and oriented to person, place, and time.  Skin: Skin is warm and dry.  Psychiatric: He has a normal mood and affect.        LABS    Recent Labs     02/08/15  0353  02/09/15  0406  02/10/15  0657  HGB  14.1  11.9*  14.3  HCT  42.8  35.3*  43.3  MCV  90.5  89.1  89.5  WBC  22.1*  11.0*  16.2*  BUN  22*  26*  39*  CREATININE  1.34*  1.21  1.42*  GLUCOSE  120*  164*  163*  CALCIUM  8.5*  8.4*  8.9  ,    No results for input(s): PH in the last 72 hours.  Invalid input(s): PCO2, PO2, BASEEXCESS, BASEDEFICITE, TFT    CULTURE RESULTS   Recent Results (from the past 240 hour(s))  Urine culture     Status: None   Collection Time: 02/05/15  7:47 PM  Result Value Ref Range Status   Specimen Description URINE, CLEAN CATCH  Final   Special Requests Normal  Final   Culture NO GROWTH 2 DAYS  Final   Report Status 02/07/2015 FINAL  Final  MRSA PCR Screening     Status: None   Collection Time: 02/06/15  6:55 AM  Result Value Ref Range Status   MRSA by PCR NEGATIVE NEGATIVE Final    Comment:        The GeneXpert MRSA Assay (FDA approved for NASAL specimens only), is one component of a comprehensive MRSA colonization surveillance program. It is not intended to diagnose MRSA infection nor to guide or monitor treatment for MRSA infections.   Culture, blood (routine x 2)     Status: None (Preliminary result)   Collection Time: 02/08/15  8:47 AM  Result Value Ref Range Status   Specimen Description BLOOD LEFT ASSIST CONTROL  Final   Special Requests   Final    BOTTLES DRAWN AEROBIC AND ANAEROBIC  AER 2CC ANA 1CC   Culture NO GROWTH 2 DAYS  Final   Report Status PENDING  Incomplete  Culture, blood (routine x 2)     Status: None (Preliminary  result)   Collection Time: 02/08/15  8:55 AM  Result Value Ref Range Status   Specimen Description BLOOD LEFT HAND  Final   Special Requests BOTTLES DRAWN AEROBIC AND ANAEROBIC  1CC  Final   Culture NO GROWTH 2 DAYS  Final   Report Status PENDING  Incomplete  Culture, expectorated sputum-assessment     Status: None   Collection Time: 02/09/15  9:15 AM  Result Value Ref Range Status   Specimen Description SPUTUM  Final   Special Requests Normal  Final   Sputum evaluation THIS SPECIMEN IS ACCEPTABLE FOR SPUTUM CULTURE  Final   Report Status 02/09/2015 FINAL  Final          IMAGING    Dg Chest Port 1 View  02/10/2015   CLINICAL DATA:  Respiratory failure.  EXAM: PORTABLE CHEST - 1 VIEW  COMPARISON:  02/09/2015 .  02/07/2015.  07/31/2012.  FINDINGS: Mediastinum and hilar structures are unremarkable. Stable cardiomegaly. Persistent interstitial prominence noted throughout both lung fields consistent with active interstitial process such as pneumonitis and interstitial edema. These changes are superimposed on chronic interstitial lung disease. No pleural effusion or pneumothorax.  IMPRESSION: 1. Persistent unchanged interstitial prominence consistent with active interstitial process such as pneumonitis or interstitial pulmonary edema. These changes are superimposed on chronic interstitial lung disease.  2.  Stable cardiomegaly.   Electronically Signed   By: Marcello Moores  Register   On: 02/10/2015 07:29       ASSESSMENT/PLAN   81 yo white male with with hypoxic resp failure from acute pneumonia and cardiogenic pulm edema from afib with RVR with underlying likelihood of pulmonary fibrosis   1.continue to wean fio2 as tolerated 2.continue IV abx and IV steroids 3.continue BD therapy 4.continue Iv lasix  Patient now DNR/DNI, follow up palliative care consult recs-patient may be hospice appropriate, prognosis is very poor.  I have personally obtained a history, examined the patient,  evaluated laboratory and independently reviewed imaging results, formulated the assessment and plan and placed orders.  The Patient requires high complexity decision making for assessment and support, frequent evaluation and titration of therapies, application of advanced monitoring technologies and extensive interpretation of multiple databases. Time spent with patient 35 minutes.  Patient is satisfied with Plan of action and management.    Corrin Parker, M.D.  [facility]  Medical Director [place] Director [facility] Cardio-Pulmonary Department

## 2015-02-11 DIAGNOSIS — J9621 Acute and chronic respiratory failure with hypoxia: Secondary | ICD-10-CM

## 2015-02-11 LAB — BASIC METABOLIC PANEL
Anion gap: 14 (ref 5–15)
BUN: 45 mg/dL — ABNORMAL HIGH (ref 6–20)
CALCIUM: 9.3 mg/dL (ref 8.9–10.3)
CHLORIDE: 98 mmol/L — AB (ref 101–111)
CO2: 26 mmol/L (ref 22–32)
CREATININE: 1.29 mg/dL — AB (ref 0.61–1.24)
GFR calc Af Amer: 55 mL/min — ABNORMAL LOW (ref >60)
GFR calc non Af Amer: 47 mL/min — ABNORMAL LOW (ref >60)
Glucose, Bld: 144 mg/dL — ABNORMAL HIGH (ref 65–99)
Potassium: 4.1 mmol/L (ref 3.5–5.1)
Sodium: 138 mmol/L (ref 135–145)

## 2015-02-11 LAB — CBC
HEMATOCRIT: 47 % (ref 40.0–52.0)
Hemoglobin: 15.1 g/dL (ref 13.0–18.0)
MCH: 28.9 pg (ref 26.0–34.0)
MCHC: 32.1 g/dL (ref 32.0–36.0)
MCV: 90.1 fL (ref 80.0–100.0)
PLATELETS: 396 10*3/uL (ref 150–440)
RBC: 5.21 MIL/uL (ref 4.40–5.90)
RDW: 14.9 % — ABNORMAL HIGH (ref 11.5–14.5)
WBC: 22.4 10*3/uL — ABNORMAL HIGH (ref 3.8–10.6)

## 2015-02-11 MED ORDER — VANCOMYCIN HCL IN DEXTROSE 1-5 GM/200ML-% IV SOLN
1000.0000 mg | INTRAVENOUS | Status: AC
  Administered 2015-02-11: 1000 mg via INTRAVENOUS
  Filled 2015-02-11: qty 200

## 2015-02-11 MED ORDER — LEVOFLOXACIN IN D5W 500 MG/100ML IV SOLN
500.0000 mg | INTRAVENOUS | Status: DC
  Administered 2015-02-12: 500 mg via INTRAVENOUS
  Filled 2015-02-11 (×2): qty 100

## 2015-02-11 MED ORDER — DILTIAZEM HCL ER COATED BEADS 120 MG PO CP24
120.0000 mg | ORAL_CAPSULE | Freq: Every day | ORAL | Status: DC
  Administered 2015-02-11 – 2015-02-13 (×3): 120 mg via ORAL
  Filled 2015-02-11 (×3): qty 1

## 2015-02-11 MED ORDER — VANCOMYCIN HCL IN DEXTROSE 1-5 GM/200ML-% IV SOLN
1000.0000 mg | INTRAVENOUS | Status: DC
  Filled 2015-02-11: qty 200

## 2015-02-11 MED ORDER — DEXTROSE 5 % IV SOLN
500.0000 mg | INTRAVENOUS | Status: DC
  Administered 2015-02-11: 500 mg via INTRAVENOUS
  Filled 2015-02-11 (×2): qty 500

## 2015-02-11 NOTE — Clinical Documentation Improvement (Signed)
Would you please help clarify the medical condition related to the clinical findings?  Acute Renal Failure/Acute Kidney Injury Acute Tubular Necrosis Acute Renal Cortical Necrosis Acute Renal Medullary Necrosis Other Condition Cannot Clinically Determine   Clinical findings - On 7/31 BUN, creatinine, and changes to GFR.  BUN started to raise to 22, then 26, 39, and 45 today.  Creatinine started to rise at 1.34, then 1.21, 1.42, and today at 1.29.  GFR started at >60 and today is 55.   Thank You, Margretta Sidle ,RN Clinical Documentation Specialist:    [facility] [phone] Cell - [phone]

## 2015-02-11 NOTE — Progress Notes (Signed)
[facility] [place] NAME: Richard Marsh    MR#:  [id number]  DATE OF BIRTH:  [birth year]  SUBJECTIVE:  CHIEF COMPLAINT:   Chief Complaint  Patient presents with  . Fecal Impaction   Continues to require high flow oxygen. Alert oriented and conversant. No distress, no new complaints  REVIEW OF SYSTEMS:   Review of Systems  Constitutional: Positive for malaise/fatigue. Negative for fever.  Respiratory: Positive for cough, sputum production, shortness of breath and wheezing.   Cardiovascular: Negative for chest pain and palpitations.  Gastrointestinal: Negative for nausea, vomiting and abdominal pain.  Genitourinary: Negative for dysuria.  Neurological: Positive for weakness.    DRUG ALLERGIES:   Allergies  Allergen Reactions  . Demerol [Meperidine] Anaphylaxis  . Statins Other (See Comments)    Reaction:  Unknown     VITALS:  Blood pressure 126/69, pulse 62, temperature 97.6 F (36.4 C), temperature source Oral, resp. rate 18, height 5\' 10"  (1.778 m), weight 71.532 kg (157 lb 11.2 oz), SpO2 92 %.  PHYSICAL EXAMINATION:  GENERAL:  81 y.o.-year-old patient lying in the bed thin, short of breath, High flow nasal cannula LUNGS: Short shallow respirations, diffuse wheezing and rhonchi, poor air movement CARDIOVASCULAR: S1, S2 normal. No murmurs, rubs, or gallops. Tachycardic irregular ABDOMEN: Soft, nontender, nondistended. Bowel sounds present. No organomegaly or mass.  EXTREMITIES: No pedal edema, cyanosis, or clubbing.  NEUROLOGIC: Cranial nerves II through XII are grossly intact.   PSYCHIATRIC: The patient is alert and oriented x 3.  SKIN: No obvious rash, lesion, or ulcer.   LABORATORY PANEL:   CBC  Recent Labs Lab 02/11/15 0341  WBC 22.4*  HGB 15.1  HCT 47.0  PLT 396   ------------------------------------------------------------------------------------------------------------------  Chemistries   Recent  Labs Lab 02/05/15 1934  02/11/15 0341  NA 129*  < > 138  K 4.6  < > 4.1  CL 96*  < > 98*  CO2 23  < > 26  GLUCOSE 104*  < > 144*  BUN 20  < > 45*  CREATININE 1.16  < > 1.29*  CALCIUM 8.9  < > 9.3  AST 24  --   --   ALT 9*  --   --   ALKPHOS 69  --   --   BILITOT 1.0  --   --   < > = values in this interval not displayed. ------------------------------------------------------------------------------------------------------------------  Cardiac Enzymes  Recent Labs Lab 02/05/15 1934  TROPONINI <0.03   ------------------------------------------------------------------------------------------------------------------  RADIOLOGY:  Dg Chest Port 1 View  02/10/2015   CLINICAL DATA:  Respiratory failure.  EXAM: PORTABLE CHEST - 1 VIEW  COMPARISON:  02/09/2015 .  02/07/2015.  07/31/2012.  FINDINGS: Mediastinum and hilar structures are unremarkable. Stable cardiomegaly. Persistent interstitial prominence noted throughout both lung fields consistent with active interstitial process such as pneumonitis and interstitial edema. These changes are superimposed on chronic interstitial lung disease. No pleural effusion or pneumothorax.  IMPRESSION: 1. Persistent unchanged interstitial prominence consistent with active interstitial process such as pneumonitis or interstitial pulmonary edema. These changes are superimposed on chronic interstitial lung disease.  2.  Stable cardiomegaly.   Electronically Signed   By: Marcello Moores  Register   On: 02/10/2015 07:29    EKG:   Orders placed or performed in visit on 05/25/13  . EKG 12-Lead    ASSESSMENT AND PLAN:    1) acute respiratory failure with hypoxia due to pulmonary fibrosis, healthcare associated pneumonia, concern for aspiration  pneumonia - Pulmonology consultation appreciated - Continue with high flow oxygen via nasal cannula, taper as tolerated - Solu-Medrol, nebulizers, as cultures have all been negative we'll simplify anabiotic start Levaquin  discontinue vancomycin and Zosyn - Speech therapy has seen and does not feel that he needs diet modification, he does need aspiration precautions however needs to sit up and/drink carefully. - Agree with addition of when necessary morphine  2) healthcare associated pneumonia - Blood cultures negative to date, sputum culture with heavy growth of yeast-will add Diflucan - Chest x-ray improved  3) UTI: Urine culture negative. Covered with Rocephin/Zosyn and Levaquin  4) hyponatremia: Serum sodium is normal today  5) T12 compression fracture:  - s/p kyphoplasty. Pain greatly reduced.  6) delirium: Resolved.  7) atrial fibrillation with rapid ventricular rate - Heart rate currently controlled on diltiazem - Appreciate cardiology consultation - Echo shows normal LV function, mild pulmonary hypertension  #8 hypertension: - Appreciate palliative care changing blood pressure medications due to hypotension now that Cardizem has been added.   CODE STATUS: DO NOT RESUSCITATE, appreciate palliative care consultation   TOTAL CRITICAL CARE TIME TAKING CARE OF THIS PATIENT: 35 minutes.   POSSIBLE D/C IN 3-4 DAYS, DEPENDING ON CLINICAL CONDITION. Once patient able to wean off high flow nasal cannula will be ready for discharge to skilled nursing. Would be beneficial to have hospice following.   Myrtis Ser M.D on 02/11/2015 at 2:16 PM  Between 7am to 6pm - Pager - [phone]  After 6pm go to [email or web address] - password [email or web address]  [facility]  Office  [phone]  CC: Primary care physician; No primary care provider on file.

## 2015-02-11 NOTE — Clinical Social Work Note (Signed)
CSW spoke to pt.  He was alert and Ox3 when speaking to CSW.  He understood that he would need rehab.  He stated that he did not want to go to [facility].  He preferred [facility].  CSW will reach out facilities, however pt cannot DC until his O2 is 5L or under.  CSW will continue to follow

## 2015-02-11 NOTE — Progress Notes (Signed)
No new complaints. Remains on high flow [place] O2   MICRO DATA: Urine 7/28 >> NEG Resp 8/01 >> yeast (presumed ot be nonpathogen) Blood 7/31 >>    ANTIMICROBIALS:  Pip-tazo 8/01 >> 8/03 Levofloxacin 8/03 >>    Filed Vitals:   02/11/15 0159 02/11/15 0404 02/11/15 0752 02/11/15 0857  BP:  131/72  126/69  Pulse:  111  62  Temp:  97.8 F (36.6 C)  97.6 F (36.4 C)  TempSrc:    Oral  Resp:  24  18  Height:      Weight:      SpO2: 90% 89% 92% 92%   NAD No JVD Diffuse bilateral crackles, no wheezes IRIR, mildly tachycardic, no M noted NABS, Soft, NT Ext warm without edema  BMET    Component Value Date/Time   NA 138 02/11/2015 0341   NA 133* 05/25/2013 0346   K 4.1 02/11/2015 0341   K 4.2 05/25/2013 0346   CL 98* 02/11/2015 0341   CL 103 05/25/2013 0346   CO2 26 02/11/2015 0341   CO2 24 05/25/2013 0346   GLUCOSE 144* 02/11/2015 0341   GLUCOSE 100* 05/25/2013 0346   BUN 45* 02/11/2015 0341   BUN 18 05/25/2013 0346   CREATININE 1.29* 02/11/2015 0341   CREATININE 1.53* 05/25/2013 0346   CALCIUM 9.3 02/11/2015 0341   CALCIUM 8.9 05/25/2013 0346   GFRNONAA 47* 02/11/2015 0341   GFRNONAA 40* 05/25/2013 0346   GFRAA 55* 02/11/2015 0341   GFRAA 46* 05/25/2013 0346    CBC    Component Value Date/Time   WBC 22.4* 02/11/2015 0341   WBC 9.9 05/25/2013 0346   RBC 5.21 02/11/2015 0341   RBC 4.56 05/25/2013 0346   HGB 15.1 02/11/2015 0341   HGB 13.6 05/25/2013 0346   HCT 47.0 02/11/2015 0341   HCT 40.5 05/25/2013 0346   PLT 396 02/11/2015 0341   PLT 223 05/25/2013 0346   MCV 90.1 02/11/2015 0341   MCV 89 05/25/2013 0346   MCH 28.9 02/11/2015 0341   MCH 29.9 05/25/2013 0346   MCHC 32.1 02/11/2015 0341   MCHC 33.7 05/25/2013 0346   RDW 14.9* 02/11/2015 0341   RDW 14.1 05/25/2013 0346   LYMPHSABS 3.4 02/05/2015 1934   MONOABS 1.3* 02/05/2015 1934   EOSABS 0.3 02/05/2015 1934   BASOSABS 0.1 02/05/2015 1934    CXR: NNF  IMPRESSION: Acute on chronic hypoxic  respiratory failure Chronic ILD/ pulm fibrosis  Possible PNA, NOS AFRVR - rate adequately controlled   PLAN/REC: There are no specific therapies to be offered for pulm fibrosis other than O2 Cont O2 titrated to maintain SpO2 90-95% Complete 7 day course of abx Agree with DNR decision. Would consider Hospice after discharge Agree with plans for NH placement as he is unlikely to thrive in home setting He informs me that it has been recommended that he go to rehab first and asked my opinion of this recommendation. I explained that I thought there was probably limited benefit to be had from rehab since the major limiting factor for him is (and will likely always be) his respiratory status  PCCM will sign off. Please call if we can be of further assistance   Merton Border, MD ; [facility] [phone].  After 5:30 PM or weekends, call [phone]

## 2015-02-11 NOTE — Care Management (Signed)
Patient remains on high flow 02.  Will not be able to transition to skilled nursing.  If unable to decrease 02 requirements within the next 24 hours, may introduce concept of  ltac

## 2015-02-11 NOTE — Progress Notes (Signed)
Physical Therapy Treatment Patient Details Name: Richard Marsh MRN: [id number] DOB: January 28, 1923 Today's Date: 02/11/2015    History of Present Illness Richard Marsh is a 81 y.o. male with a known history of chronic constipation, GERD with esophagitis, essential hypertension presenting with back pain. Denies any recent trauma or falls approximately 1 week prior had an episode where he apparently "slid off the couch" no pain at that time. Now describes back pain, lower back over the spine approximately 4 days worse with movement, nonradiating, "pain" quality, 8/10 intensity. Imaging  Imaging revealed T12 compression fx.    PT Comments    Patient tolerating increased OOB activity, upright positioning this date.  Continues to desat (84%) with minimal exertion, requiring extended seated rest periods for recovery (>88% on HFNC).  Patient very eager, motivated to progress, but limited by cardiorespiratory status.   Follow Up Recommendations  SNF     Equipment Recommendations  Rolling walker with 5" wheels    Recommendations for Other Services       Precautions / Restrictions Precautions Precautions: Fall;Back Restrictions Weight Bearing Restrictions: No    Mobility  Bed Mobility               General bed mobility comments: seated in chair upon arrival to session  Transfers Overall transfer level: Needs assistance   Transfers: Sit to/from Stand Sit to Stand: Min assist         General transfer comment: cuing for hand placement; assist for lift off and anterior weight translation  Ambulation/Gait             General Gait Details: unable to tolerate secondary to O2 desaturation with standing   Stairs            Wheelchair Mobility    Modified Rankin (Stroke Patients Only)       Balance Overall balance assessment: Needs assistance Sitting-balance support: No upper extremity supported;Feet supported Sitting balance-Leahy Scale: Good     Standing  balance support: Bilateral upper extremity supported Standing balance-Leahy Scale: Fair                      Cognition Arousal/Alertness: Awake/alert Behavior During Therapy: WFL for tasks assessed/performed Overall Cognitive Status: Within Functional Limits for tasks assessed                      Exercises Other Exercises Other Exercises: Sit/stand with RW, min assist x3; progressed to include alternate LE forward/retro stepping and balance correction (tendancy towards posterior LOB).  Requires min instruction and physical assist from therapist for balance correction.  Seated rest after each repetition of standing activity due to SOB/O2 recovery.    General Comments        Pertinent Vitals/Pain Pain Assessment: No/denies pain    Home Living                      Prior Function            PT Goals (current goals can now be found in the care plan section) Acute Rehab PT Goals Patient Stated Goal: to walk PT Goal Formulation: With patient/family Time For Goal Achievement: 02/21/15 Potential to Achieve Goals: Fair Progress towards PT goals: Progressing toward goals    Frequency  Min 2X/week    PT Plan Current plan remains appropriate    Co-evaluation             End of Session Equipment Utilized During  Treatment: Gait belt;Oxygen Activity Tolerance: Patient limited by fatigue Patient left: with call bell/phone within reach;with family/visitor present;in chair     Time: 8887-5797 PT Time Calculation (min) (ACUTE ONLY): 24 min  Charges:  $Therapeutic Exercise: 8-22 mins $Therapeutic Activity: 8-22 mins                    G Codes:      Richard Marsh, PT, DPT, NCS 02/11/2015, 4:59 PM [phone]

## 2015-02-11 NOTE — Care Management Important Message (Signed)
Important Message  Patient Details  Name: QUENTIN SHOREY MRN: [id number] Date of Birth: 09/03/1922   Medicare Important Message Given:  Yes-third notification given    Juliann Pulse A Allmond 02/11/2015, 10:07 AM

## 2015-02-11 NOTE — Consult Note (Signed)
ANTIBIOTIC CONSULT NOTE - INITIAL  Pharmacy Consult for Vancomycin & Zosyn Indication: pneumonia  Allergies  Allergen Reactions  . Demerol [Meperidine] Anaphylaxis  . Statins Other (See Comments)    Reaction:  Unknown     Patient Measurements: Height: 5\' 10"  (177.8 cm) Weight: 157 lb 11.2 oz (71.532 kg) IBW/kg (Calculated) : 73 Adjusted Body Weight:   Vital Signs: Temp: 97.6 F (36.4 C) (08/03 0857) Temp Source: Oral (08/03 0857) BP: 126/69 mmHg (08/03 0857) Pulse Rate: 62 (08/03 0857) Intake/Output from previous day: 08/02 0701 - 08/03 0700 In: 120 [P.O.:120] Out: 1225 [Urine:1225] Intake/Output from this shift: Total I/O In: -  Out: 150 [Urine:150]  Labs:  Recent Labs  02/09/15 0406 02/10/15 0657 02/11/15 0341  WBC 11.0* 16.2* 22.4*  HGB 11.9* 14.3 15.1  PLT 185 306 396  CREATININE 1.21 1.42* 1.29*   Estimated Creatinine Clearance: 38.5 mL/min (by C-G formula based on Cr of 1.29). No results for input(s): VANCOTROUGH, VANCOPEAK, VANCORANDOM, GENTTROUGH, GENTPEAK, GENTRANDOM, TOBRATROUGH, TOBRAPEAK, TOBRARND, AMIKACINPEAK, AMIKACINTROU, AMIKACIN in the last 72 hours.   Microbiology: Recent Results (from the past 720 hour(s))  Urine culture     Status: None   Collection Time: 02/05/15  7:47 PM  Result Value Ref Range Status   Specimen Description URINE, CLEAN CATCH  Final   Special Requests Normal  Final   Culture NO GROWTH 2 DAYS  Final   Report Status 02/07/2015 FINAL  Final  MRSA PCR Screening     Status: None   Collection Time: 02/06/15  6:55 AM  Result Value Ref Range Status   MRSA by PCR NEGATIVE NEGATIVE Final    Comment:        The GeneXpert MRSA Assay (FDA approved for NASAL specimens only), is one component of a comprehensive MRSA colonization surveillance program. It is not intended to diagnose MRSA infection nor to guide or monitor treatment for MRSA infections.   Culture, blood (routine x 2)     Status: None (Preliminary result)   Collection Time: 02/08/15  8:47 AM  Result Value Ref Range Status   Specimen Description BLOOD LEFT ASSIST CONTROL  Final   Special Requests   Final    BOTTLES DRAWN AEROBIC AND ANAEROBIC  AER 2CC ANA 1CC   Culture NO GROWTH 2 DAYS  Final   Report Status PENDING  Incomplete  Culture, blood (routine x 2)     Status: None (Preliminary result)   Collection Time: 02/08/15  8:55 AM  Result Value Ref Range Status   Specimen Description BLOOD LEFT HAND  Final   Special Requests BOTTLES DRAWN AEROBIC AND ANAEROBIC  1CC  Final   Culture NO GROWTH 2 DAYS  Final   Report Status PENDING  Incomplete  Culture, expectorated sputum-assessment     Status: None   Collection Time: 02/09/15  9:15 AM  Result Value Ref Range Status   Specimen Description SPUTUM  Final   Special Requests Normal  Final   Sputum evaluation THIS SPECIMEN IS ACCEPTABLE FOR SPUTUM CULTURE  Final   Report Status 02/09/2015 FINAL  Final  Culture, respiratory (NON-Expectorated)     Status: None (Preliminary result)   Collection Time: 02/09/15  9:15 AM  Result Value Ref Range Status   Specimen Description SPUTUM  Final   Special Requests Normal Reflexed from [id number]  Final   Gram Stain PENDING  Incomplete   Culture   Final    HEAVY GROWTH YEAST IDENTIFICATION TO FOLLOW ONCE BETTER GROWTH    Report  Status PENDING  Incomplete    Medical History: Past Medical History  Diagnosis Date  . Cancer   . Renal disorder     Medications:  Scheduled:  . aspirin EC  81 mg Oral Daily  . azithromycin  500 mg Intravenous Q24H  . chlorhexidine  15 mL Mouth Rinse BID  . diltiazem  60 mg Oral 4 times per day  . doxepin  25 mg Oral QHS  . enoxaparin (LOVENOX) injection  40 mg Subcutaneous Q24H  . feeding supplement (ENSURE ENLIVE)  237 mL Oral TID WC  . fluconazole  100 mg Oral Daily  . ipratropium-albuterol  3 mL Nebulization Q6H  . Linaclotide  145 mcg Oral Daily  . LORazepam  1 mg Oral TID  . methylPREDNISolone (SOLU-MEDROL)  injection  40 mg Intravenous Daily  . pantoprazole  40 mg Oral QHS  . piperacillin-tazobactam (ZOSYN)  IV  3.375 g Intravenous 3 times per day  . senna-docusate  1 tablet Oral BID   Assessment: Pt is a 81 year old male with possible HCAP or aspiration pneumonia. Currently on Zosyn 3.375mg  q 8 hr Ke=0.03 T1/2=23.1 Vd=50.05  Goal of Therapy:  Vancomycin trough level 15-20 mcg/ml  Plan:  Give 1000mg  now. In 6 hours start vancomycin 1000mg  q 24hr. Check trough at steady state, prior to 5th total dose. 6 Aug at [place] 3.375mg  q 8hr EI dosing   Chonita Gadea D Jylan Loeza 02/11/2015,9:50 AM

## 2015-02-12 LAB — CULTURE, RESPIRATORY

## 2015-02-12 LAB — CULTURE, RESPIRATORY W GRAM STAIN: Special Requests: NORMAL

## 2015-02-12 MED ORDER — PNEUMOCOCCAL VAC POLYVALENT 25 MCG/0.5ML IJ INJ
0.5000 mL | INJECTION | INTRAMUSCULAR | Status: DC
Start: 2015-02-13 — End: 2015-02-13

## 2015-02-12 MED ORDER — PREDNISONE 50 MG PO TABS
50.0000 mg | ORAL_TABLET | Freq: Every day | ORAL | Status: DC
  Administered 2015-02-13: 50 mg via ORAL
  Filled 2015-02-12: qty 1

## 2015-02-12 MED ORDER — BENAZEPRIL HCL 20 MG PO TABS
10.0000 mg | ORAL_TABLET | Freq: Every day | ORAL | Status: DC
  Administered 2015-02-12 – 2015-02-13 (×2): 10 mg via ORAL
  Filled 2015-02-12 (×2): qty 1

## 2015-02-12 NOTE — Clinical Social Work Placement (Signed)
   CLINICAL SOCIAL WORK PLACEMENT  NOTE  Date:  02/12/2015  Patient Details  Name: Richard Marsh MRN: [id number] Date of Birth: 06/24/1923  Clinical Social Work is seeking post-discharge placement for this patient at the [place] level of care (*CSW will initial, date and re-position this form in  chart as items are completed):  Yes   Patient/family provided with [place] Work Department's list of facilities offering this level of care within the geographic area requested by the patient (or if unable, by the patient's family).  Yes   Patient/family informed of their freedom to choose among providers that offer the needed level of care, that participate in Medicare, Medicaid or managed care program needed by the patient, have an available bed and are willing to accept the patient.  Yes   Patient/family informed of [facility]'s ownership interest in [facility] and [facility], as well as of the fact that they are under no obligation to receive care at these facilities.  PASRR submitted to EDS on 02/07/15     PASRR number received on 02/07/15     Existing PASRR number confirmed on       FL2 transmitted to all facilities in geographic area requested by pt/family on 02/07/15     FL2 transmitted to all facilities within larger geographic area on       Patient informed that his/her managed care company has contracts with or will negotiate with certain facilities, including the following:        Yes   Patient/family informed of bed offers received.  Patient chooses bed at [facility]     Physician recommends and patient chooses bed at      Patient to be transferred to [facility] on  .  Patient to be transferred to facility by       Patient family notified on   of transfer.  Name of family member notified:        PHYSICIAN Please sign FL2     Additional Comment:     _______________________________________________ Mathews Argyle, LCSW 02/12/2015, 4:20 PM

## 2015-02-12 NOTE — Care Management (Addendum)
Within the last 24 hours, there is an exertional 02 sat on HFNC of 83%.  His FIO2 has decreased from 50% to 34%.  Attending wishes not to discuss ltac as of yet- to give another 24 hours

## 2015-02-12 NOTE — Progress Notes (Signed)
[place] at [place] NAME: Azzam Mehra    MR#:  [id number]  DATE OF BIRTH:  09/08/1922  SUBJECTIVE:  CHIEF COMPLAINT:   Chief Complaint  Patient presents with  . Fecal Impaction   Continues to require high flow oxygen, though flow rate is decreasing. He is alert and oriented. No new complaints. Seems slightly depressed this morning  REVIEW OF SYSTEMS:   Review of Systems  Constitutional: Positive for malaise/fatigue. Negative for fever.  Respiratory: Positive for cough, sputum production, shortness of breath and wheezing.   Cardiovascular: Negative for chest pain and palpitations.  Gastrointestinal: Negative for nausea, vomiting and abdominal pain.  Genitourinary: Negative for dysuria.  Neurological: Positive for weakness.    DRUG ALLERGIES:   Allergies  Allergen Reactions  . Demerol [Meperidine] Anaphylaxis  . Statins Other (See Comments)    Reaction:  Unknown     VITALS:  Blood pressure 146/71, pulse 65, temperature 98.4 F (36.9 C), temperature source Oral, resp. rate 20, height 5\' 10"  (1.778 m), weight 71.532 kg (157 lb 11.2 oz), SpO2 92 %.  PHYSICAL EXAMINATION:  GENERAL:  81 y.o.-year-old patient sitting in chair short of breath, High flow nasal cannula LUNGS: Short shallow respirations, diffuse wheezing and rhonchi, poor air movement CARDIOVASCULAR: S1, S2 normal. No murmurs, rubs, or gallops. Tachycardic irregular ABDOMEN: Soft, nontender, nondistended. Bowel sounds present. No organomegaly or mass.  EXTREMITIES: No pedal edema, cyanosis, or clubbing.  NEUROLOGIC: Cranial nerves II through XII are grossly intact.   PSYCHIATRIC: The patient is alert and oriented x 3.  SKIN: No obvious rash, lesion, or ulcer.   LABORATORY PANEL:   CBC  Recent Labs Lab 02/11/15 0341  WBC 22.4*  HGB 15.1  HCT 47.0  PLT 396    ------------------------------------------------------------------------------------------------------------------  Chemistries   Recent Labs Lab 02/05/15 1934  02/11/15 0341  NA 129*  < > 138  K 4.6  < > 4.1  CL 96*  < > 98*  CO2 23  < > 26  GLUCOSE 104*  < > 144*  BUN 20  < > 45*  CREATININE 1.16  < > 1.29*  CALCIUM 8.9  < > 9.3  AST 24  --   --   ALT 9*  --   --   ALKPHOS 69  --   --   BILITOT 1.0  --   --   < > = values in this interval not displayed. ------------------------------------------------------------------------------------------------------------------  Cardiac Enzymes  Recent Labs Lab 02/05/15 1934  TROPONINI <0.03   ------------------------------------------------------------------------------------------------------------------  RADIOLOGY:  No results found.  EKG:   Orders placed or performed in visit on 05/25/13  . EKG 12-Lead    ASSESSMENT AND PLAN:    1) acute respiratory failure with hypoxia due to pulmonary fibrosis, healthcare associated pneumonia, concern for aspiration pneumonia - Pulmonology consultation appreciated - Continue with high flow oxygen via nasal cannula, taper as tolerated making progress today and decreasing flow rate - Solu-Medrol, nebulizers, as cultures have all been negative we'll discontinue anti-biotics - Speech therapy has seen and does not feel that he needs diet modification, he does need aspiration precautions however needs to sit up and/drink carefully. - Agree with addition of when necessary morphine - If unable to wean off of high flow nasal cannula will need to consider LTAC  2) healthcare associated pneumonia - Blood cultures negative to date, sputum culture with heavy growth of yeast, this is not considered to be a pathogen discontinued  the IV fluid can - Chest x-ray improved  3) UTI: Urine culture negative. Has completed course. Discontinue antibody X   4) hyponatremia: Serum sodium is normal  today  5) T12 compression fracture:  - s/p kyphoplasty. Pain greatly reduced. Controlled with Tylenol  6) delirium: Resolved.  7) atrial fibrillation with rapid ventricular rate - Heart rate currently controlled on diltiazem - Appreciate cardiology consultation - Echo shows normal LV function, mild pulmonary hypertension  #8 hypertension: - Controlled   CODE STATUS: DO NOT RESUSCITATE, appreciate palliative care consultation   TOTAL CRITICAL CARE TIME TAKING CARE OF THIS PATIENT: 35 minutes.   POSSIBLE D/C IN 1-2 DAYS, DEPENDING ON CLINICAL CONDITION. Once patient able to wean off high flow nasal cannula will be ready for discharge to skilled nursing. Would be beneficial to have hospice following.   Myrtis Ser M.D on 02/12/2015 at 2:53 PM  Between 7am to 6pm - Pager - [phone]  After 6pm go to [email or web address] - password [email or web address] [facility]  [place] [facility]  Office  [phone]  CC: Primary care physician; No primary care provider on file.

## 2015-02-12 NOTE — Progress Notes (Signed)
Physical Therapy Treatment Patient Details Name: Richard Marsh MRN: [id number] DOB: Jun 17, 1923 Today's Date: 02/12/2015    History of Present Illness Richard Marsh is a 81 y.o. male with a known history of chronic constipation, GERD with esophagitis, essential hypertension presenting with back pain. Denies any recent trauma or falls approximately 1 week prior had an episode where he apparently "slid off the couch" no pain at that time. Now describes back pain, lower back over the spine approximately 4 days worse with movement, nonradiating, "pain" quality, 8/10 intensity. Imaging  Imaging revealed T12 compression fx.    PT Comments    Pt O2 intake decreased to 4L/min, so O2 was monitored throughout the session and rest breaks were taken in between each transfer/therex set until O2 normalized to at least 88% (lowest O2 dropped to with therex was 80%). Pt was able to transfer to standing with RW/min assist in order to urinate, demonstrated improved standing balance with only L UE support/min assist required. Pt was able to complete partial squats/forward  stepping with min assist/RW and displayed improved sitting balance with seat therex sitting at the EOB.  Pt would benefit from skilled PT in order to increase cardiorespiratory endurance, increase gross strength, improve with transfers, and improve with standing balance.    Follow Up Recommendations  SNF     Equipment Recommendations  Rolling walker with 5" wheels    Recommendations for Other Services       Precautions / Restrictions Precautions Precautions: Fall;Back Restrictions Weight Bearing Restrictions: No    Mobility  Bed Mobility                  Transfers Overall transfer level: Needs assistance Equipment used: Rolling walker (2 wheeled) Transfers: Sit to/from Stand Sit to Stand: Min assist         General transfer comment: Pt was slightly impulsive with standing transfers, verbal cuing required to ensure  that pt doesn't stand without assist from PT. Pt shows slower movement sequencing, takes time for pt to get up.   Ambulation/Gait                 Stairs            Wheelchair Mobility    Modified Rankin (Stroke Patients Only)       Balance Overall balance assessment: Needs assistance Sitting-balance support: Feet supported Sitting balance-Leahy Scale: Good Sitting balance - Comments: Pt was able to complete reaching therex in recliner chair with no UE support, min assist was provided for safety purposes.     Standing balance support: Bilateral upper extremity supported Standing balance-Leahy Scale: Fair Standing balance comment: With standing therex, pt demonstrated heavy UE compensation on RW, fatigued quickly because of this and was able to stand for ~20 sec secondary to dropping O2 sats/ general weakness.                     Cognition Arousal/Alertness: Awake/alert Behavior During Therapy: WFL for tasks assessed/performed Overall Cognitive Status: Within Functional Limits for tasks assessed                      Exercises Other Exercises Other Exercises: Seated bilat hip marches/LAQ, 2 x 10 with rest break in between each set until O2 sats normalized.  Other Exercises: Seated bilat shoulder flex with isometric hip add/abd throughout the set, 1 x 10, with rest break in between each set until O2 sats normalized.  Other Exercises: Standing with  RW/mod assist bilat forward toe touches/partial squats with heavy UE compensation, 1 x 10 with rest break in between each set until O2 sats normalized.    General Comments        Pertinent Vitals/Pain Pain Assessment: No/denies pain    Home Living Family/patient expects to be discharged to:: Skilled nursing facility                    Prior Function            PT Goals (current goals can now be found in the care plan section) Acute Rehab PT Goals Patient Stated Goal: to walk PT Goal  Formulation: With patient/family Time For Goal Achievement: 02/21/15 Potential to Achieve Goals: Fair Progress towards PT goals: Progressing toward goals    Frequency  Min 2X/week    PT Plan Current plan remains appropriate    Co-evaluation             End of Session Equipment Utilized During Treatment: Gait belt;Oxygen Activity Tolerance: Patient limited by fatigue Patient left: with call bell/phone within reach;in chair;with nursing/sitter in room     Time: 0246-0321 PT Time Calculation (min) (ACUTE ONLY): 35 min  Charges:                       G Codes:      Bernestine Amass, SPT 07-Mar-2015 3:43 PM

## 2015-02-12 NOTE — Progress Notes (Signed)
HF [place] with oxygen saturation 91-93%. Slept most of night with non productive cough noted. Telemetry reading S/R with PAC's. No complaints of shortness of breath or pain.

## 2015-02-13 LAB — BASIC METABOLIC PANEL
ANION GAP: 6 (ref 5–15)
BUN: 54 mg/dL — ABNORMAL HIGH (ref 6–20)
CALCIUM: 8.4 mg/dL — AB (ref 8.9–10.3)
CO2: 29 mmol/L (ref 22–32)
Chloride: 101 mmol/L (ref 101–111)
Creatinine, Ser: 1.18 mg/dL (ref 0.61–1.24)
GFR calc non Af Amer: 52 mL/min — ABNORMAL LOW (ref >60)
Glucose, Bld: 115 mg/dL — ABNORMAL HIGH (ref 65–99)
Potassium: 4.8 mmol/L (ref 3.5–5.1)
SODIUM: 136 mmol/L (ref 135–145)

## 2015-02-13 LAB — CBC
HCT: 39.9 % — ABNORMAL LOW (ref 40.0–52.0)
Hemoglobin: 13.2 g/dL (ref 13.0–18.0)
MCH: 29.6 pg (ref 26.0–34.0)
MCHC: 33 g/dL (ref 32.0–36.0)
MCV: 89.6 fL (ref 80.0–100.0)
Platelets: 290 K/uL (ref 150–440)
RBC: 4.46 MIL/uL (ref 4.40–5.90)
RDW: 14.5 % (ref 11.5–14.5)
WBC: 16.5 K/uL — ABNORMAL HIGH (ref 3.8–10.6)

## 2015-02-13 LAB — CULTURE, BLOOD (ROUTINE X 2)
CULTURE: NO GROWTH
Culture: NO GROWTH

## 2015-02-13 MED ORDER — BENAZEPRIL HCL 10 MG PO TABS: 10.0000 mg | ORAL_TABLET | Freq: Every day | ORAL | Status: AC

## 2015-02-13 MED ORDER — ENSURE ENLIVE PO LIQD: 237.0000 mL | Freq: Three times a day (TID) | ORAL | Status: AC

## 2015-02-13 MED ORDER — IPRATROPIUM-ALBUTEROL 0.5-2.5 (3) MG/3ML IN SOLN: 3.0000 mL | Freq: Four times a day (QID) | RESPIRATORY_TRACT | Status: AC

## 2015-02-13 MED ORDER — DILTIAZEM HCL ER COATED BEADS 120 MG PO CP24: 120.0000 mg | ORAL_CAPSULE | Freq: Every day | ORAL | Status: AC

## 2015-02-13 MED ORDER — PREDNISONE 20 MG PO TABS: ORAL_TABLET | ORAL | Status: AC

## 2015-02-13 NOTE — Clinical Social Work Note (Signed)
CSW contacted pt's daughter [name] and notified her that [facility] is holding a bed for pt once he is stable for DC.  DC summary is needed today if pt is to DC over the weekend.  Facility cannot accept weekend DC if they do not have summary.

## 2015-02-13 NOTE — Care Management (Signed)
Patient is now on 2 liters nasal cannula so no longer will consider LTAC.  Will move forward with skilled nursing placement

## 2015-02-13 NOTE — Discharge Summary (Signed)
[place] [facility] [place]   PATIENT NAME: Richard Marsh    MR#:  [id number]  DATE OF BIRTH:  [birth year]  DATE OF ADMISSION:  02/05/2015 ADMITTING PHYSICIAN: Lytle Butte, MD  DATE OF DISCHARGE: 02/13/2015  PRIMARY CARE PHYSICIAN: No primary care provider on file.    ADMISSION DIAGNOSIS:  Hyponatremia [E87.1] Compression fracture [T14.8]  DISCHARGE DIAGNOSIS:  Principal Problem:   Acute respiratory failure Active Problems:   UTI (lower urinary tract infection)   T12 compression fracture   Constipation   Hyponatremia   Aspiration pneumonia   Idiopathic pulmonary fibrosis   SECONDARY DIAGNOSIS:   Past Medical History  Diagnosis Date  . Cancer   . Renal disorder     HOSPITAL COURSE:    1) acute respiratory failure with hypoxia: this patient has a long history of pulmonary fibrosis. He was never treated for this in the past never required supplemental oxygenation. During the hospitalization on July 31 he had acute decompensation of his respiratory status. He briefly required BiPAP, but was transitioned to high flow via nasal cannula. He was treated for pulmonary fibrosis exacerbation with steroid nebulizers and anti-biotics. Cultures have been negative. He has been evaluated by speech therapy and no aspiration was identified however he does need aspiration precautions due to age and weakness. He was seen by pulmonology during the hospitalization. At this point he has been able to titrate off of high flow nasal cannula to regular nasal cannula at a rate of 4 L of oxygen per minute with oxygen saturations in the 90s.he has completed his course of anti-biotics. He will continue on a slow prednisone taper. He will continue to need nebulizer treatments, as I do not think he would be able to coordinate an inhaler inhalation.  2) healthcare associated pneumonia: As hypoxia developed during the hospitalization and was treated as a  healthcare associated pneumonia. He was treated with IV vancomycin and Zosyn which was D escalated toLevaquin. He has completed a full course of Anna biotics. Blood cultures sputum cultures negative for bacterial infection. Sputum culture did show heavy growth of yeast which is not thought to be considered pathogenic. Chest x-ray is improving.  3) UTI: Urine culture negative. Has completed course of therapy. No further antiemetics needed.   4) T12 compression fracture: Likely due to fall at home as well as chronic osteoporosis. He is now status post kyphoplasty with greatly reduced pain. He is taking Tylenol only for pain. Continue with physical therapy.  5) atrial fibrillation with rapid ventricular rate: He was seen by cardiology during the hospitalization. His heart rate was controlled initially on IV and then transitioned to oral Cardizem. His 2-D echocardiogram shows normal LV function with mild pulmonary hypertension likely due to pulmonary fibrosis. Heart rate is currently controlled.  6) hypertension: Controlled on the above listed regimen. He did require some dose adjustments as we added Cardizem for rate control. Amlodipine was discontinued.  7)disposition: Patient will be discharged to skilled nursing facility. He is a DO NOT RESUSCITATE. He was seen by palliative care during this hospitalization. I think he would be a good candidate for hospice and hospice screen should be performed either prior to discharge or at the living facility  DISCHARGE CONDITIONS:   fair  CONSULTS OBTAINED:  Treatment Team:  Lytle Butte, MD Corky Mull, MD Yolonda Kida, MD  DRUG ALLERGIES:   Allergies  Allergen Reactions  . Demerol [Meperidine] Anaphylaxis  . Statins  Other (See Comments)    Reaction:  Unknown     DISCHARGE MEDICATIONS:   Current Discharge Medication List    START taking these medications   Details  benazepril (LOTENSIN) 10 MG tablet Take 1 tablet (10 mg total) by  mouth daily. Qty: 30 tablet, Refills: 0    diltiazem (CARDIZEM CD) 120 MG 24 hr capsule Take 1 capsule (120 mg total) by mouth daily. Qty: 30 capsule, Refills: 0    feeding supplement, ENSURE ENLIVE, (ENSURE ENLIVE) LIQD Take 237 mLs by mouth 3 (three) times daily with meals. Qty: 237 mL, Refills: 12    ipratropium-albuterol (DUONEB) 0.5-2.5 (3) MG/3ML SOLN Take 3 mLs by nebulization every 6 (six) hours. Qty: 360 mL, Refills: 11    predniSONE (DELTASONE) 20 MG tablet 2 tablets for 5 days and then 1 tablet for 5 days and then 1/2 for 5 days and then stop. Qty: 20 tablet, Refills: 0      CONTINUE these medications which have NOT CHANGED   Details  aspirin EC 81 MG tablet Take 81 mg by mouth daily.    doxepin (SINEQUAN) 25 MG capsule Take 25 mg by mouth at bedtime.    Linaclotide (LINZESS) 145 MCG CAPS capsule Take 145 mcg by mouth daily.    LORazepam (ATIVAN) 1 MG tablet Take 1 mg by mouth 3 (three) times daily.    omeprazole (PRILOSEC) 20 MG capsule Take 20 mg by mouth 2 (two) times daily.      STOP taking these medications     amLODipine-benazepril (LOTREL) 5-10 MG per capsule          DISCHARGE INSTRUCTIONS:    DIET:  Regular diet. Aspiration precautions.  DISCHARGE CONDITION:  Fair  ACTIVITY:  Activity as tolerated  OXYGEN:  Home Oxygen: Yes.     Oxygen Delivery: 4 liters/min via Patient connected to nasal cannula oxygen  DISCHARGE LOCATION:  nursing home   If you experience worsening of your admission symptoms, develop shortness of breath, life threatening emergency, suicidal or homicidal thoughts you must seek medical attention immediately by calling 911 or calling your MD immediately  if symptoms less severe.  You Must read complete instructions/literature along with all the possible adverse reactions/side effects for all the Medicines you take and that have been prescribed to you. Take any new Medicines after you have completely understood and accpet  all the possible adverse reactions/side effects.   Please note  You were cared for by a hospitalist during your hospital stay. If you have any questions about your discharge medications or the care you received while you were in the hospital after you are discharged, you can call the unit and asked to speak with the hospitalist on call if the hospitalist that took care of you is not available. Once you are discharged, your primary care physician will handle any further medical issues. Please note that NO REFILLS for any discharge medications will be authorized once you are discharged, as it is imperative that you return to your primary care physician (or establish a relationship with a primary care physician if you do not have one) for your aftercare needs so that they can reassess your need for medications and monitor your lab values.   Today   CHIEF COMPLAINT:   Chief Complaint  Patient presents with  . Fecal Impaction    HISTORY OF PRESENT ILLNESS:  Richard Marsh is a 81 y.o. male with a known history of chronic constipation, GERD with esophagitis, essential hypertension presenting  with back pain. Denies any recent trauma or falls approximately 1 week prior had an episode where he apparently "slid off the couch" no pain at that time. Now describes back pain, lower back over the spine approximately 4 days worse with movement, nonradiating, "pain" quality, 8/10 intensity. Has chronic issues with constipation however the last bowel movement approximately 1 week ago still passing flatus, poor by mouth intake.  VITAL SIGNS:  Blood pressure 146/62, pulse 57, temperature 98.2 F (36.8 C), temperature source Oral, resp. rate 22, height 5\' 10"  (1.778 m), weight 71.532 kg (157 lb 11.2 oz), SpO2 95 %.  I/O:    Intake/Output Summary (Last 24 hours) at 02/13/15 1030 Last data filed at 02/13/15 0830  Gross per 24 hour  Intake      0 ml  Output   1010 ml  Net  -1010 ml    PHYSICAL EXAMINATION:   GENERAL: 81 y.o.-year-old patient sitting in chair short , comfortable, no distress, nasal cannula LUNGS: Short shallow respirations, diffuse wheezing and rhonchi, fair air movement CARDIOVASCULAR: S1, S2 normal. No murmurs, rubs, or gallops. Tachycardic irregular ABDOMEN: Soft, nontender, nondistended. Bowel sounds present. No organomegaly or mass.  EXTREMITIES: No pedal edema, cyanosis, or clubbing.  NEUROLOGIC: Cranial nerves II through XII are grossly intact.  PSYCHIATRIC: The patient is alert and oriented x 3. tangential in his speech SKIN: No obvious rash, lesion, or ulcer.   DATA REVIEW:   CBC  Recent Labs Lab 02/13/15 0333  WBC 16.5*  HGB 13.2  HCT 39.9*  PLT 290    Chemistries   Recent Labs Lab 02/13/15 0333  NA 136  K 4.8  CL 101  CO2 29  GLUCOSE 115*  BUN 54*  CREATININE 1.18  CALCIUM 8.4*    Cardiac Enzymes No results for input(s): TROPONINI in the last 168 hours.  Microbiology Results  Results for orders placed or performed during the hospital encounter of 02/05/15  Urine culture     Status: None   Collection Time: 02/05/15  7:47 PM  Result Value Ref Range Status   Specimen Description URINE, CLEAN CATCH  Final   Special Requests Normal  Final   Culture NO GROWTH 2 DAYS  Final   Report Status 02/07/2015 FINAL  Final  MRSA PCR Screening     Status: None   Collection Time: 02/06/15  6:55 AM  Result Value Ref Range Status   MRSA by PCR NEGATIVE NEGATIVE Final    Comment:        The GeneXpert MRSA Assay (FDA approved for NASAL specimens only), is one component of a comprehensive MRSA colonization surveillance program. It is not intended to diagnose MRSA infection nor to guide or monitor treatment for MRSA infections.   Culture, blood (routine x 2)     Status: None   Collection Time: 02/08/15  8:47 AM  Result Value Ref Range Status   Specimen Description BLOOD LEFT ASSIST CONTROL  Final   Special Requests   Final    BOTTLES DRAWN  AEROBIC AND ANAEROBIC  AER 2CC ANA [place]   Culture NO GROWTH 5 DAYS  Final   Report Status 02/13/2015 FINAL  Final  Culture, blood (routine x 2)     Status: None   Collection Time: 02/08/15  8:55 AM  Result Value Ref Range Status   Specimen Description BLOOD LEFT HAND  Final   Special Requests BOTTLES DRAWN AEROBIC AND ANAEROBIC  1CC  Final   Culture NO GROWTH 5 DAYS  Final  Report Status 02/13/2015 FINAL  Final  Culture, expectorated sputum-assessment     Status: None   Collection Time: 02/09/15  9:15 AM  Result Value Ref Range Status   Specimen Description SPUTUM  Final   Special Requests Normal  Final   Sputum evaluation THIS SPECIMEN IS ACCEPTABLE FOR SPUTUM CULTURE  Final   Report Status 02/09/2015 FINAL  Final  Culture, respiratory (NON-Expectorated)     Status: None   Collection Time: 02/09/15  9:15 AM  Result Value Ref Range Status   Specimen Description SPUTUM  Final   Special Requests Normal Reflexed from [id number]  Final   Gram Stain   Final    FEW WBC SEEN MODERATE YEAST FAIR SPECIMEN - 70-80% WBCS    Culture HEAVY GROWTH CANDIDA ALBICANS  Final   Report Status 02/12/2015 FINAL  Final    RADIOLOGY:  No results found.  EKG:   Orders placed or performed in visit on 05/25/13  . EKG 12-Lead      Management plans discussed with the patient, family and they are in agreement.  CODE STATUS:     Code Status Orders        Start     Ordered   02/10/15 1003  Do not attempt resuscitation (DNR)   Continuous    Question Answer Comment  In the event of cardiac or respiratory ARREST Do not call a "code blue"   In the event of cardiac or respiratory ARREST Do not perform Intubation, CPR, defibrillation or ACLS   In the event of cardiac or respiratory ARREST Use medication by any route, position, wound care, and other measures to relive pain and suffering. May use oxygen, suction and manual treatment of airway obstruction as needed for comfort.      02/10/15 1002       TOTAL TIME TAKING CARE OF THIS PATIENT: 40 minutes.  Greater than 50% of time spent in care coordination and counseling.  Myrtis Ser M.D on 02/13/2015 at 10:30 AM  Between 7am to 6pm - Pager - [phone]  After 6pm go to [email or web address] - password [email or web address] [facility]  [facility]  Office  [phone]  CC: Primary care physician; No primary care provider on file.

## 2015-02-13 NOTE — Progress Notes (Signed)
Speech Language Pathology Treatment: Dysphagia  Patient Details Name: Richard Marsh MRN: [id number] DOB: [birth year] Today's Date: 02/13/2015 Time: 0830-0900 SLP Time Calculation (min) (ACUTE ONLY): 30 min  Assessment / Plan / Recommendation Clinical Impression  Pt appears to be tolerating his current diet w/ no overt s/s of aspiration noted following general aspiration precautions and taking rest breaks during meals when nec. to avoid any increased exertion during the meal. Discussed these precautions w/ pt who gave verbal agreement. Pt appears at his baseline w/ swallowing; no further skilled ST services indicated at this time. NSG to reconsult if nec. Pt agreed. NSG updated.    HPI Other Pertinent Information: Pt is a 81 y.o. male with a known history of chronic constipation, pulmonary fibrosis, GERD with esophagitis, Barrett's Esophagus, essential hypertension presenting with back pain. Denies any recent trauma or falls approximately 1 week prior had an episode where he apparently "slid off the couch" no pain at that time. Now describes back pain, lower back over the spine approximately 4 days worse with movement, nonradiating, "pain" quality, 8/10 intensity. Son reported pt's oral intake heavily consists of drinking Ensures at home(pt does have Reflux per his report; Barrett's Esophagus). Pt had kyphoplasty done post admission followed by declined respiratory status w/ distress and increased support needs; afib w/ rapid ventricular rate being tx'd by MD. Pt appears weak and easily SOB w/ exertion. He remains on HFNC for O2 support. He has been tolerating his current mech soft diet w/ thin liquids w/out s/s of aspiration noted per NSG. NSG during the night reported difficulty swallowing pills but unsure of details of this. Pt has been tolerating his oral diet per NSG w/ no overt s/s of aspiration noted.    Pertinent Vitals Pain Assessment: No/denies pain  SLP Plan  All goals met    Recommendations  Diet recommendations: Dysphagia 3 (mechanical soft);Thin liquid Liquids provided via: Cup;Straw Medication Administration: Whole meds with puree Supervision: Patient able to self feed;Intermittent supervision to cue for compensatory strategies Compensations: Slow rate;Small sips/bites Postural Changes and/or Swallow Maneuvers: Seated upright 90 degrees (rest breaks to avoid any increased exertion during meals)              Oral Care Recommendations: Oral care BID;Oral care before and after PO;Staff/trained caregiver to provide oral care Follow up Recommendations: Skilled Nursing facility (TBD) Plan: All goals met    [place], [place], CCC-SLP  Richard Marsh 02/13/2015, 2:20 PM

## 2015-02-13 NOTE — Clinical Social Work Note (Signed)
CSW notified facility, RN, pt and pt's daughter that pt would DC today to [facility].  Daughter will transport she is picking up an O2 tank from facility for transport.  CSW signing off.

## 2015-02-13 NOTE — Care Management Important Message (Signed)
Important Message  Patient Details  Name: Richard Marsh MRN: [id number] Date of Birth: [birth year]   Medicare Important Message Given:  Yes-fourth notification given    Darius Bump Allmond 02/13/2015, 9:51 AM

## 2015-02-13 NOTE — Progress Notes (Signed)
Report called to [facility], no answer, left report on admission voice mail.  IV site DCd, bleeding controlled, tele monitor turned in.  Pt's daughter preferred to make his f/u appt so she can arrange to take him.

## 2015-03-26 DIAGNOSIS — F39 Unspecified mood [affective] disorder: Secondary | ICD-10-CM

## 2015-03-26 DIAGNOSIS — L89152 Pressure ulcer of sacral region, stage 2: Secondary | ICD-10-CM | POA: Diagnosis not present

## 2015-03-26 DIAGNOSIS — J8410 Pulmonary fibrosis, unspecified: Secondary | ICD-10-CM | POA: Diagnosis not present

## 2015-03-26 DIAGNOSIS — K210 Gastro-esophageal reflux disease with esophagitis: Secondary | ICD-10-CM

## 2015-03-26 DIAGNOSIS — E440 Moderate protein-calorie malnutrition: Secondary | ICD-10-CM | POA: Diagnosis not present

## 2015-03-26 DIAGNOSIS — S22089A Unspecified fracture of T11-T12 vertebra, initial encounter for closed fracture: Secondary | ICD-10-CM | POA: Diagnosis not present

## 2015-03-26 DIAGNOSIS — I10 Essential (primary) hypertension: Secondary | ICD-10-CM

## 2015-04-07 DIAGNOSIS — H01003 Unspecified blepharitis right eye, unspecified eyelid: Secondary | ICD-10-CM

## 2015-04-07 DIAGNOSIS — H01009 Unspecified blepharitis unspecified eye, unspecified eyelid: Secondary | ICD-10-CM

## 2015-04-16 DIAGNOSIS — K210 Gastro-esophageal reflux disease with esophagitis: Secondary | ICD-10-CM | POA: Diagnosis not present

## 2015-04-16 DIAGNOSIS — J8410 Pulmonary fibrosis, unspecified: Secondary | ICD-10-CM | POA: Diagnosis not present

## 2015-04-16 DIAGNOSIS — E440 Moderate protein-calorie malnutrition: Secondary | ICD-10-CM | POA: Diagnosis not present

## 2015-04-16 DIAGNOSIS — S22079A Unspecified fracture of T9-T10 vertebra, initial encounter for closed fracture: Secondary | ICD-10-CM | POA: Diagnosis not present

## 2015-05-03 ENCOUNTER — Telehealth: Payer: Self-pay | Admitting: Family Medicine

## 2015-05-03 NOTE — Telephone Encounter (Signed)
Patient is at [facility] and under hospice care. Nurse reports increased SOB, cough as he is nearing end of life. She requested a Rx for Roxanol.   I spoke the the pharmacist for [facility] and she accepted an emergency verbal order: Roxanol 20 mg/1 mL, 0.5 mL Q2 hour PRN pain/air hunger.

## 2015-05-04 ENCOUNTER — Telehealth: Payer: Self-pay | Admitting: Family Medicine

## 2015-05-04 NOTE — Telephone Encounter (Signed)
Seen today with son Has roxanol for comfort care

## 2015-05-04 NOTE — Telephone Encounter (Signed)
FYI  PLEASE NOTE: All timestamps contained within this report are represented as [place] Standard Time. CONFIDENTIALTY NOTICE: This fax transmission is intended only for the addressee. It contains information that is legally privileged, confidential or otherwise protected from use or disclosure. If you are not the intended recipient, you are strictly prohibited from reviewing, disclosing, copying using or disseminating any of this information or taking any action in reliance on or regarding this information. If you have received this fax in error, please notify us immediately by telephone so that we can arrange for its return to [place]. Phone: [phone], Toll-Free: [phone], Fax: [phone] Page: 1 of 1 Call Id: [id number] [place] Patient Name: Richard Marsh Gender: Male DOB: 1922-12-14 Age: 81 Y 30 M 18 D Return Phone Number: Address: City/State/Zip: [place] Client [place] Night - Client Client Site [place] Physician Viviana Simpler Contact Type Call Call Type Page Only Caller Name Jeddito w/ [facility] Relationship To Patient Provider Is this call to report lab results? No Return Phone Number Please choose phone number Initial Comment Caller states she needs something to help with respirations. Jacqlyn Larsen w/ [facility] [phone] Nurse Assessment Guidelines Guideline Title Affirmed Question Affirmed Notes Nurse Date/Time ([place] Time) Disp. Time Eilene Ghazi Time) Disposition Final User 05/03/2015 2:41:34 PM Send to [place], [place] 05/03/2015 2:44:05 PM Called On-Call Provider Honor Loh 05/03/2015 2:44:15 PM Page Completed Yes Honor Loh After Care Instructions Given Call Event Type User Date / Time Description Paging [facility] Phone DateTime Result/Outcome Message Type Notes Thersa Salt [phone] 05/03/2015 2:44:05 PM Called On Call Provider - Reached Doctor Paged Thersa Salt 05/03/2015 2:44:07 PM Spoke with On Call - General Message Result

## 2015-05-06 ENCOUNTER — Telehealth: Payer: Self-pay | Admitting: Internal Medicine

## 2015-05-12 NOTE — Telephone Encounter (Signed)
PLEASE NOTE: All timestamps contained within this report are represented as [place] Standard Time. CONFIDENTIALTY NOTICE: This fax transmission is intended only for the addressee. It contains information that is legally privileged, confidential or otherwise protected from use or disclosure. If you are not the intended recipient, you are strictly prohibited from reviewing, disclosing, copying using or disseminating any of this information or taking any action in reliance on or regarding this information. If you have received this fax in error, please notify us immediately by telephone so that we can arrange for its return to [place]. Phone: [phone], Toll-Free: [phone], Fax: [phone] Page: 1 of 1 Call Id: [id number] [place] Patient Name: Richard Marsh Gender: Male DOB: 07-29-1922 Age: 42 Y 2 M 21 D Return Phone Number: Address: City/State/Zip: [place] Client [place] Night - Client Client Site [place] Physician Viviana Simpler Contact Type Call Call Type Page Only Is this call to report lab results? No Return Phone Number Please choose phone number Initial Comment Caller states she is Verdis Frederickson with [facility] @[phone] reporting a death and she needs orders. Nurse Assessment Guidelines Guideline Title Affirmed Question Affirmed Notes Nurse Date/Time (Eastern Time) Disp. Time Eilene Ghazi Time) Disposition Final User 2015/05/09 1:54:46 AM Called On-Call Provider Gae Gallop 05/09/2015 1:55:57 AM Page Completed Yes Gae Gallop After Care Instructions Given Call Event Type User Date / Time Description Paging DoctorName Phone DateTime Result/Outcome Message Type Notes Simonne Martinet [phone] 2015/05/09 1:54:46 AM Called On Call Provider - Reached Doctor Paged Simonne Martinet May 09, 2015 1:55:38 AM Spoke with  On Call - General Message Result

## 2015-05-12 NOTE — Telephone Encounter (Signed)
This was expected Hospice in and family around

## 2015-05-12 DEATH — deceased

## 2016-12-15 IMAGING — CR DG LUMBAR SPINE COMPLETE 4+V
5 series · 5 of 5 positions shown · non-contrast
Comparison: None.

CLINICAL DATA: Low abdominal pain and lower back pain for 4 days.

EXAM:
LUMBAR SPINE - COMPLETE 4+ VIEW

[l-spine ap]
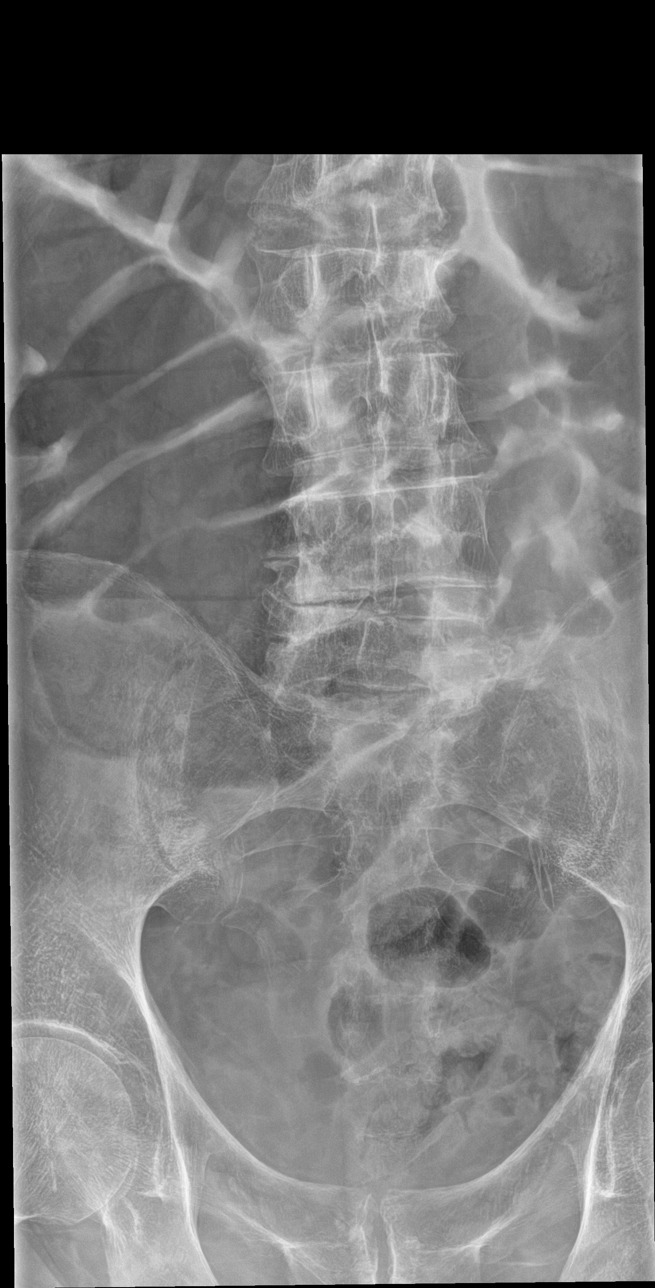

[l-spine obl (1 of 2)]
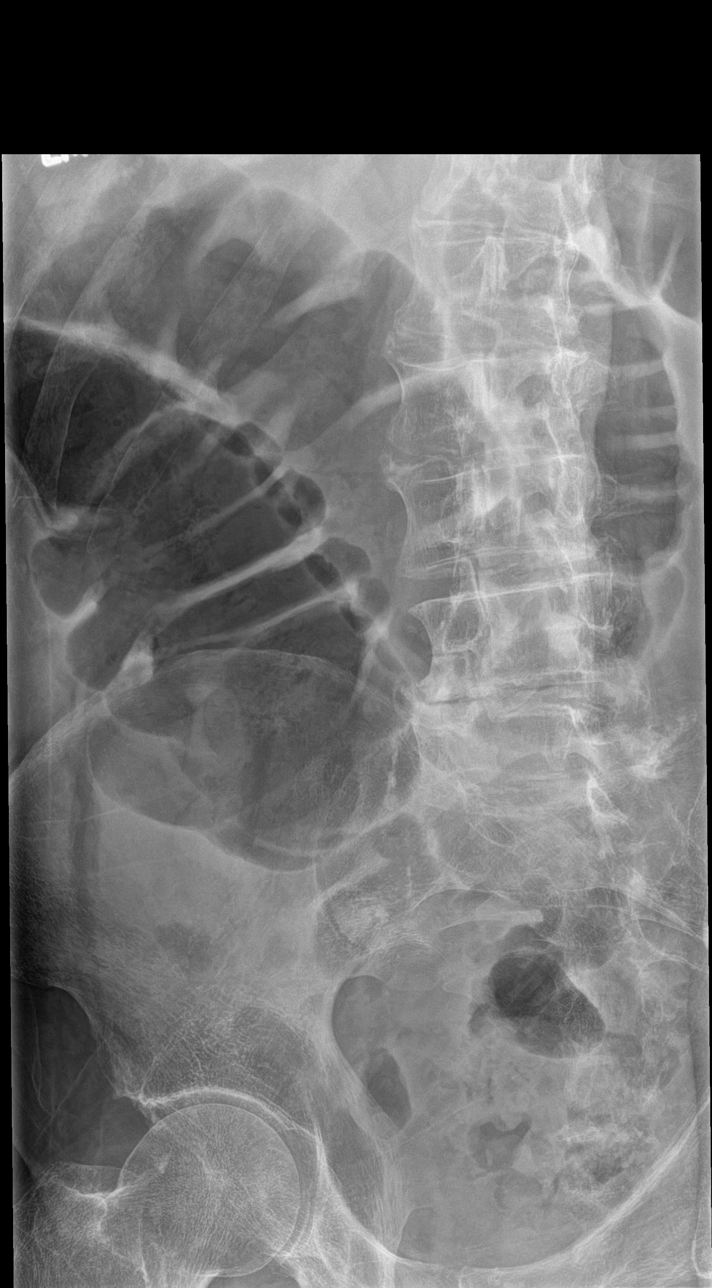

[l-spine obl (2 of 2)]
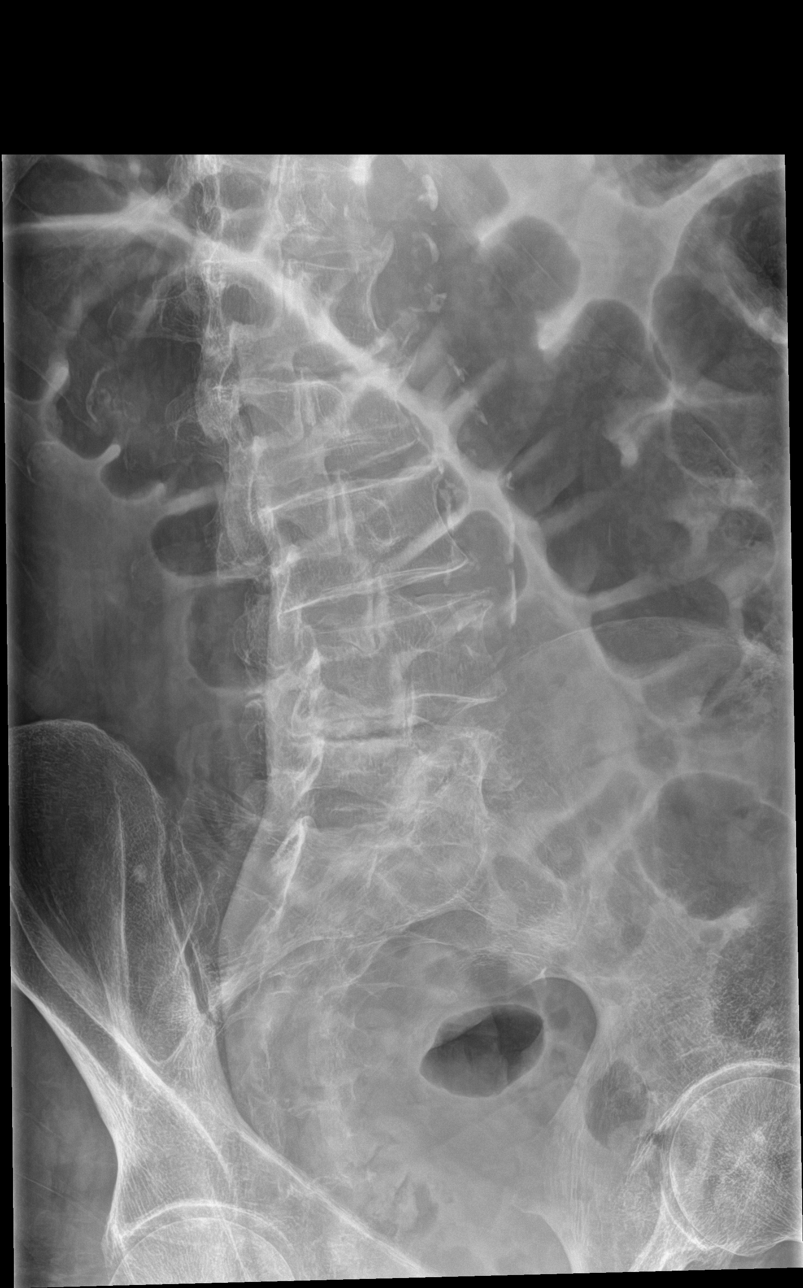

[l-spine lat]
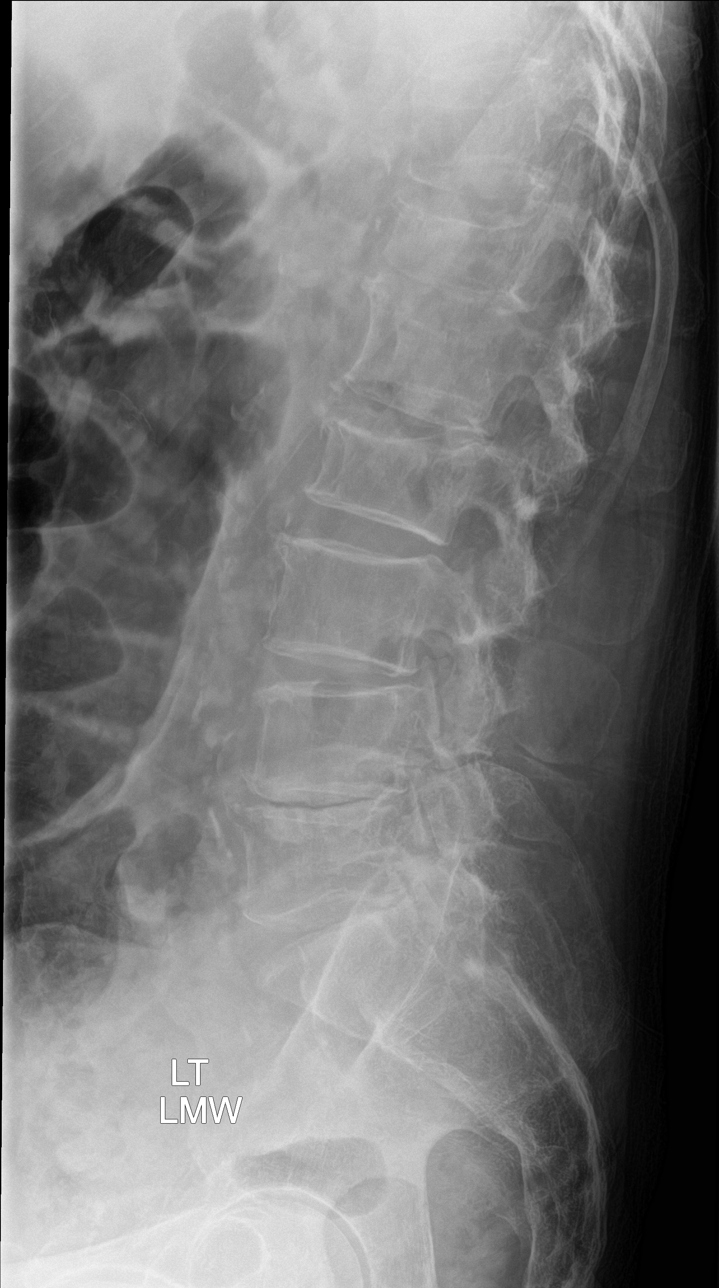

[l-spine spot]
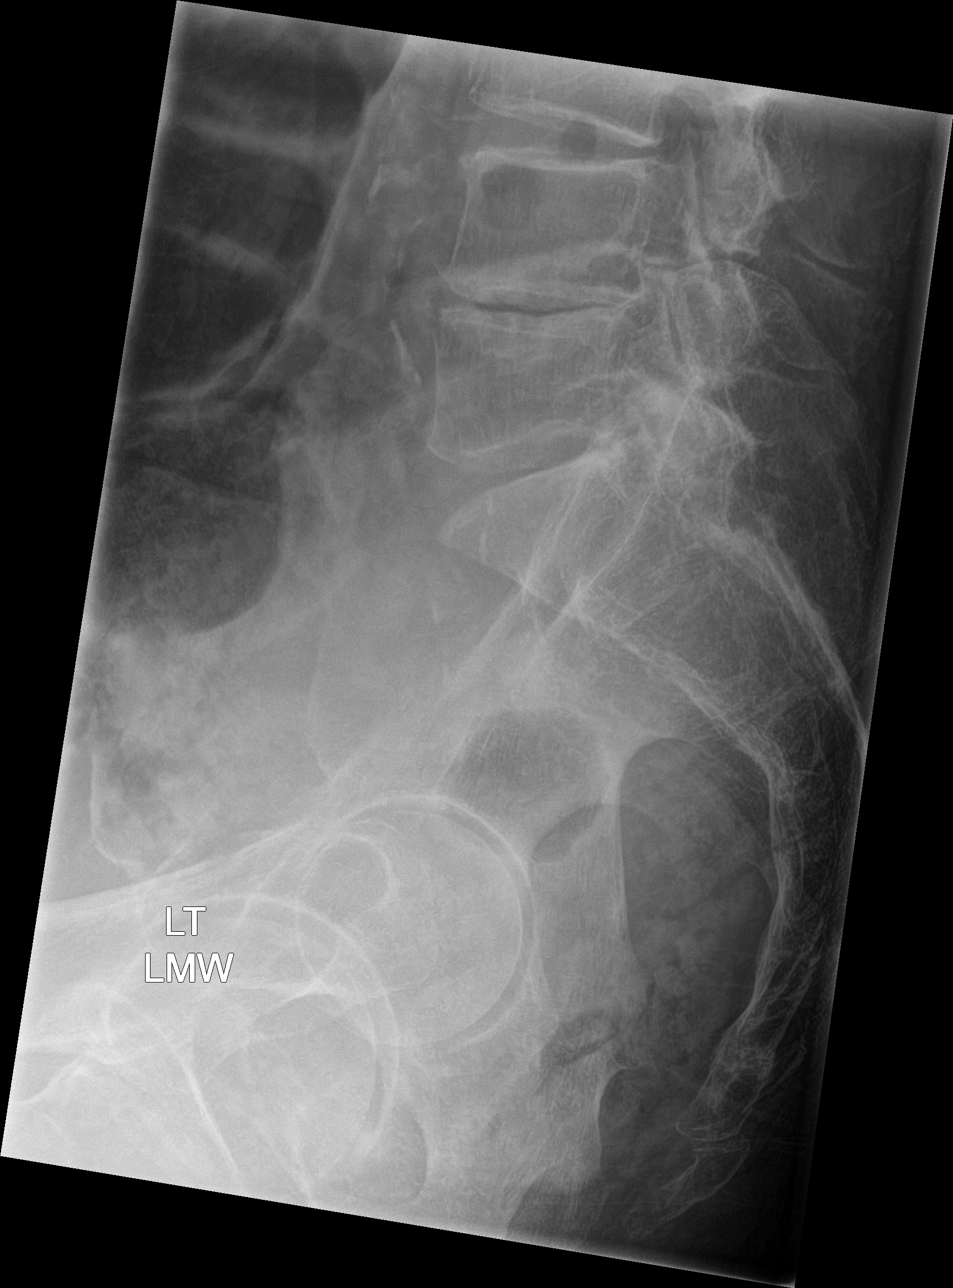

[5 of 5 positions shown; findings below may reference images not displayed]

FINDINGS: There are moderate compressions of T12 and L2. These are probably
new or worsened from a lateral chest radiograph of 07/31/2012, but
not necessarily acute. The other lumbar vertebrae are normal in
height. Moderately severe degenerative disc disease is present at
L4-5, and there is sclerotic facet arthropathy at L5-S1, left
greater than right. There is a grade 1 retrolisthesis at L4-5 which
appears to be degenerative. There is no bone lesion or bony
destruction. Sacroiliac joints appear unremarkable.
IMPRESSION: Moderately severe lumbar degenerative disc and facet change,
greatest at L4-5 and L5-S1.

Moderate compressions of T12 and L2 which are of indeterminate age
but probably new since 07/31/2012.

## 2016-12-15 IMAGING — CR DG ABDOMEN 1V
2 series · 2 of 2 positions shown · non-contrast
Comparison: CT scan of the abdomen dated 04/27/2005

CLINICAL DATA: Lower abdominal pain and low back pain.

EXAM:
ABDOMEN - 1 VIEW

[abdomen kub (1 of 2)]
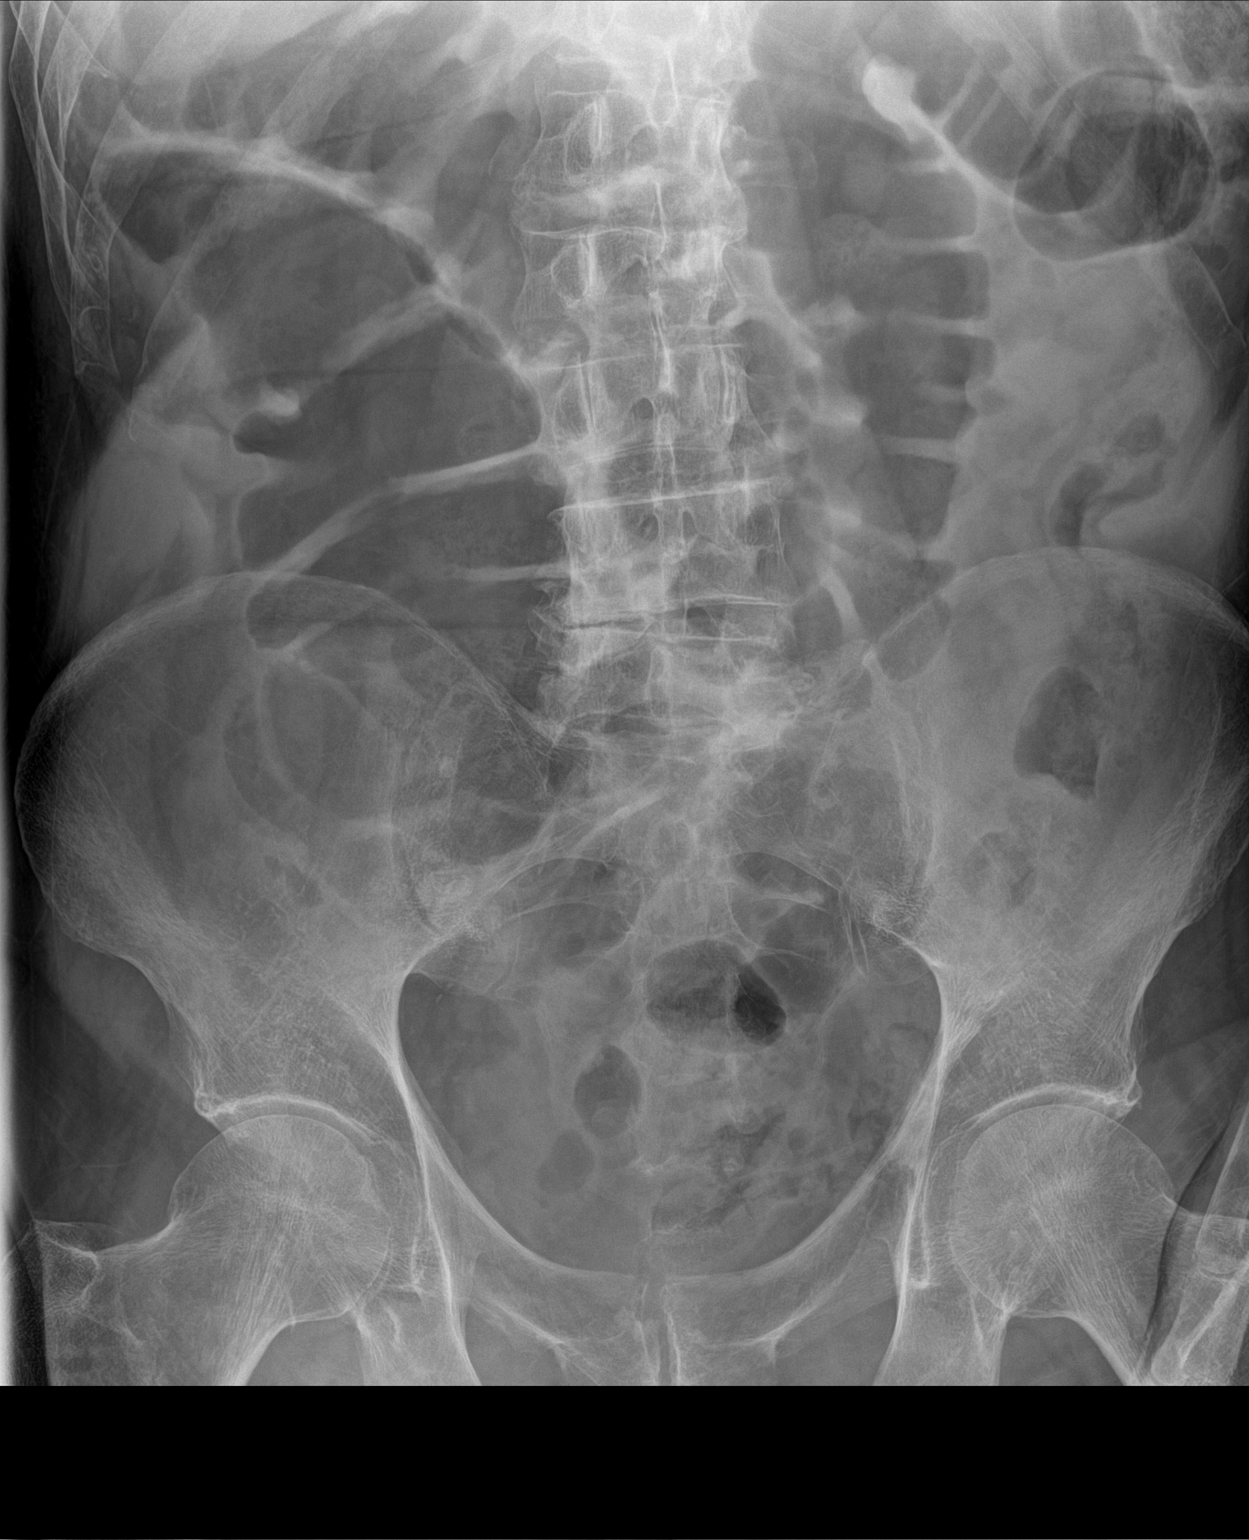

[abdomen kub (2 of 2)]
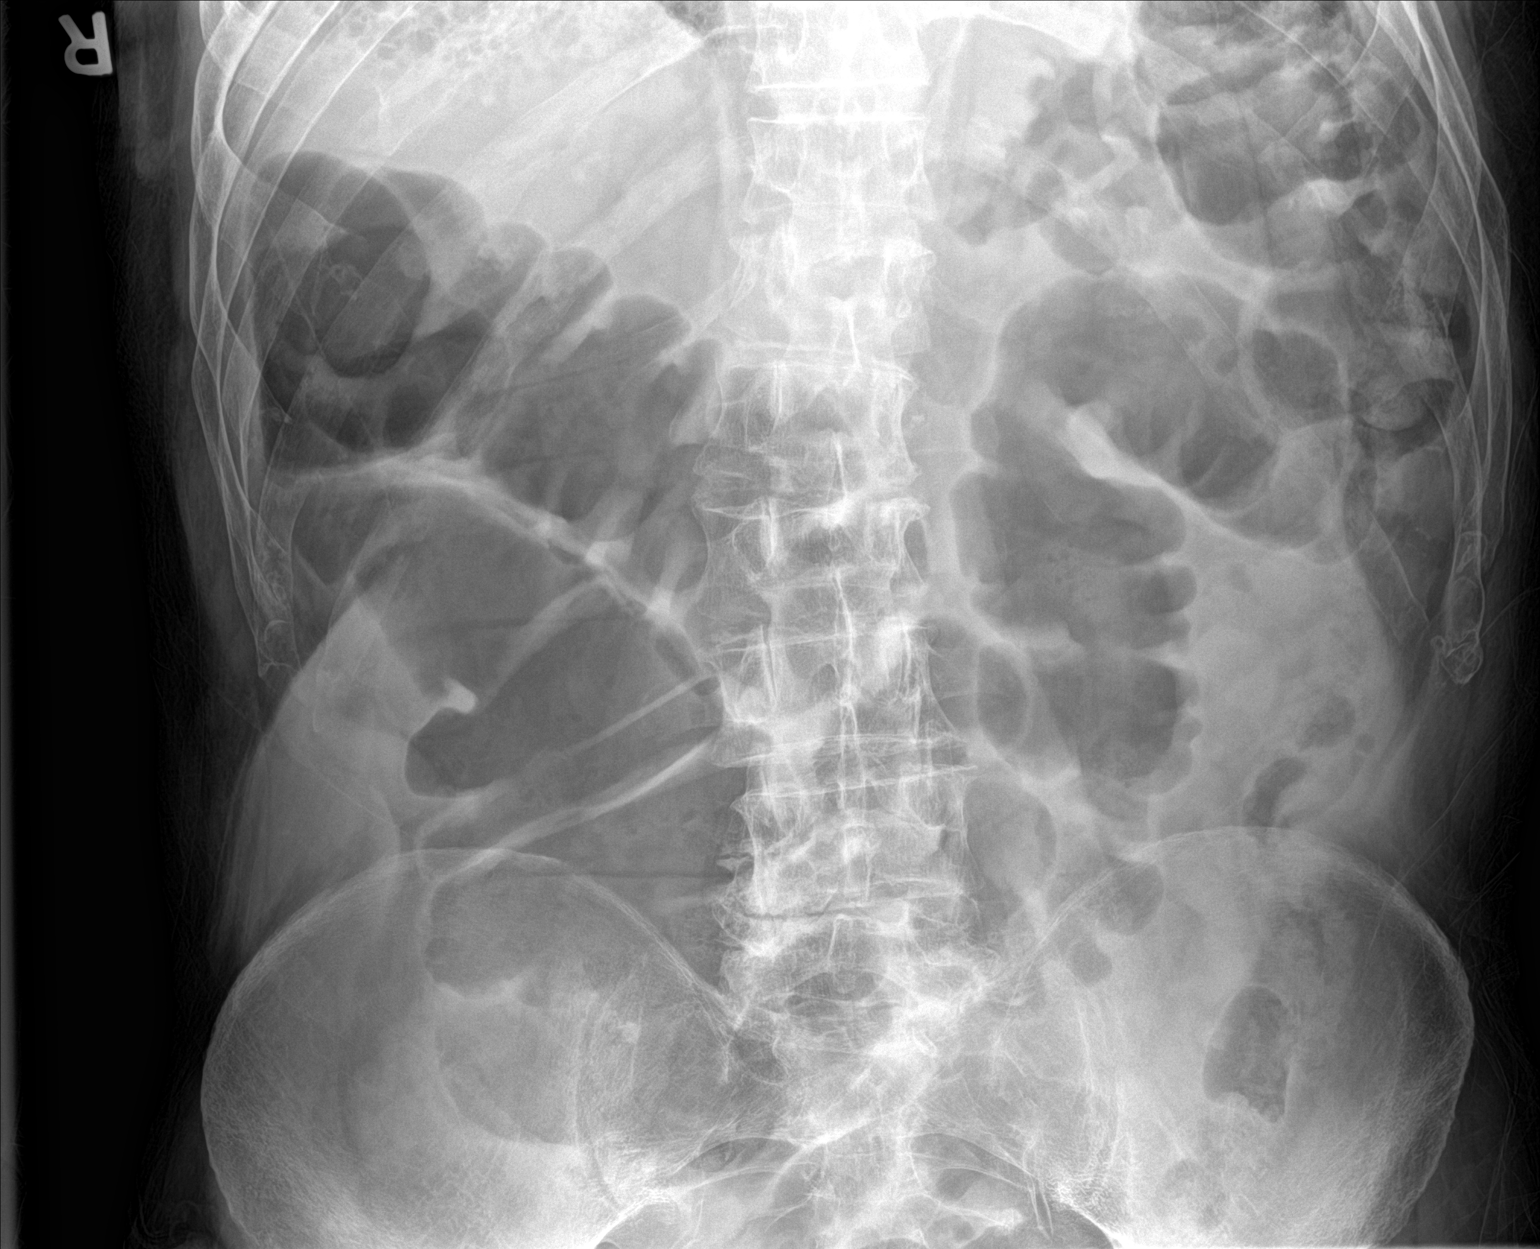

[2 of 2 positions shown; findings below may reference images not displayed]

FINDINGS: There is prominent gas in the ascending and transverse portions of
the colon but the colon is not abnormally distended. No dilated
small bowel.

There are compression deformities of T12 and L2, age indeterminate.
IMPRESSION: Benign appearing abdomen. Compression fractures in the lower
thoracic and upper lumbar spine, age indeterminate.

## 2016-12-17 IMAGING — CR DG CHEST 1V PORT
1 series · 1 of 1 positions shown · non-contrast
Comparison: 07/31/2012

CLINICAL DATA: Hypoxia

EXAM:
PORTABLE CHEST - 1 VIEW

[ap]
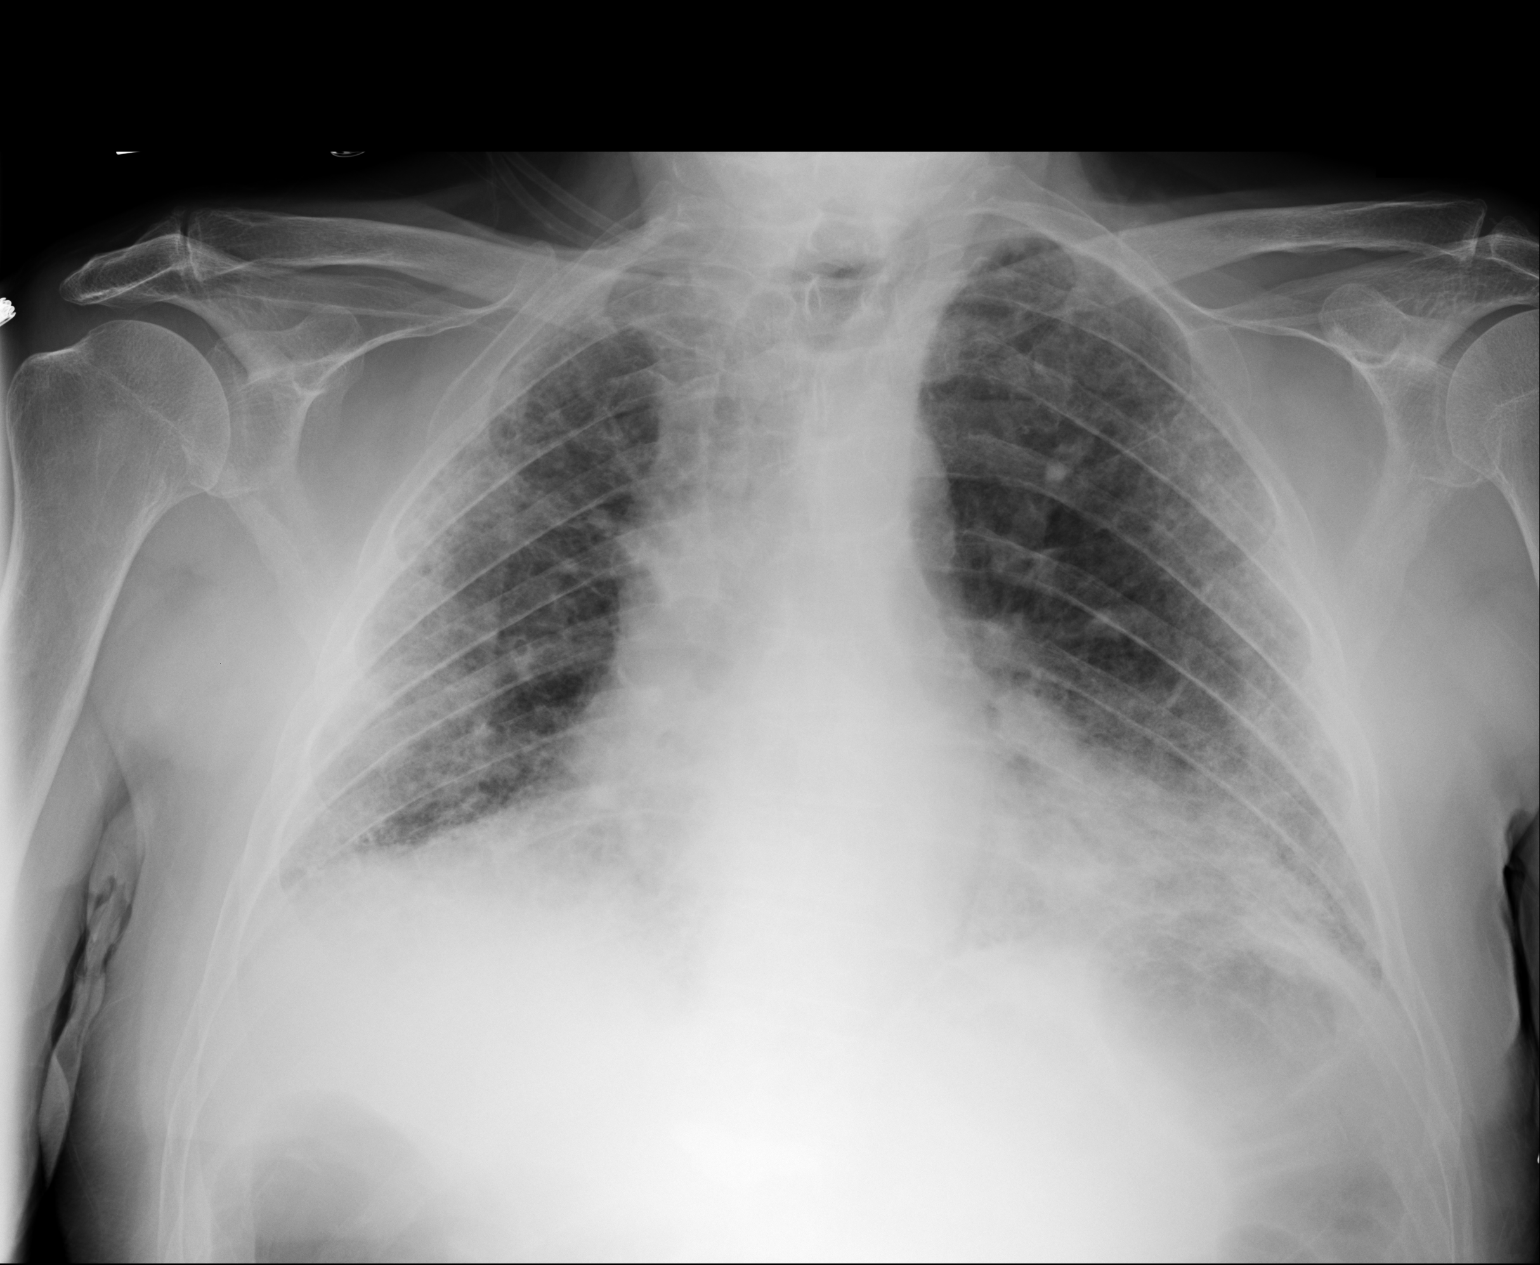

[1 of 1 positions shown; findings below may reference images not displayed]

FINDINGS: Low lung volumes.

Subpleural reticulation/ fibrosis in the bilateral lungs, suggesting
chronic interstitial lung disease.

Mild left lower lobe opacity, favored to reflect
atelectasis/scarring. Pneumonia is considered less likely.

The heart is normal in size.
IMPRESSION: Chronic interstitial lung disease.

Superimposed left lower lobe opacity, likely atelectasis/scarring,
pneumonia not excluded.
